# Patient Record
Sex: Male | Born: 1970 | Race: Black or African American | Hispanic: No | Marital: Married | Smoking: Never smoker
Health system: Southern US, Community
[De-identification: ages and names within clinical notes are randomized; demographics above are authoritative.]

## PROBLEM LIST (undated history)

## (undated) DIAGNOSIS — G4733 Obstructive sleep apnea (adult) (pediatric): Secondary | ICD-10-CM

## (undated) DIAGNOSIS — E039 Hypothyroidism, unspecified: Secondary | ICD-10-CM

## (undated) DIAGNOSIS — J309 Allergic rhinitis, unspecified: Secondary | ICD-10-CM

## (undated) DIAGNOSIS — G4730 Sleep apnea, unspecified: Secondary | ICD-10-CM

## (undated) DIAGNOSIS — R011 Cardiac murmur, unspecified: Secondary | ICD-10-CM

## (undated) DIAGNOSIS — J45909 Unspecified asthma, uncomplicated: Secondary | ICD-10-CM

## (undated) DIAGNOSIS — N62 Hypertrophy of breast: Secondary | ICD-10-CM

## (undated) DIAGNOSIS — E785 Hyperlipidemia, unspecified: Secondary | ICD-10-CM

## (undated) DIAGNOSIS — R400 Somnolence: Secondary | ICD-10-CM

## (undated) DIAGNOSIS — J302 Other seasonal allergic rhinitis: Secondary | ICD-10-CM

## (undated) DIAGNOSIS — M545 Low back pain, unspecified: Secondary | ICD-10-CM

## (undated) DIAGNOSIS — S42309A Unspecified fracture of shaft of humerus, unspecified arm, initial encounter for closed fracture: Secondary | ICD-10-CM

## (undated) DIAGNOSIS — K429 Umbilical hernia without obstruction or gangrene: Secondary | ICD-10-CM

## (undated) DIAGNOSIS — E781 Pure hyperglyceridemia: Secondary | ICD-10-CM

## (undated) DIAGNOSIS — S62109A Fracture of unspecified carpal bone, unspecified wrist, initial encounter for closed fracture: Secondary | ICD-10-CM

## (undated) DIAGNOSIS — R945 Abnormal results of liver function studies: Secondary | ICD-10-CM

## (undated) DIAGNOSIS — R7989 Other specified abnormal findings of blood chemistry: Secondary | ICD-10-CM

## (undated) DIAGNOSIS — I10 Essential (primary) hypertension: Secondary | ICD-10-CM

## (undated) DIAGNOSIS — G8929 Other chronic pain: Secondary | ICD-10-CM

## (undated) HISTORY — DX: Umbilical hernia without obstruction or gangrene: K42.9

## (undated) HISTORY — DX: Other chronic pain: G89.29

## (undated) HISTORY — DX: Sleep apnea, unspecified: G47.30

## (undated) HISTORY — PX: WISDOM TOOTH EXTRACTION: SHX21

## (undated) HISTORY — DX: Unspecified fracture of shaft of humerus, unspecified arm, initial encounter for closed fracture: S42.309A

## (undated) HISTORY — DX: Hyperlipidemia, unspecified: E78.5

## (undated) HISTORY — DX: Low back pain: M54.5

## (undated) HISTORY — DX: Other seasonal allergic rhinitis: J30.2

## (undated) HISTORY — DX: Other specified abnormal findings of blood chemistry: R79.89

## (undated) HISTORY — DX: Hypertrophy of breast: N62

## (undated) HISTORY — DX: Allergic rhinitis, unspecified: J30.9

## (undated) HISTORY — DX: Somnolence: R40.0

## (undated) HISTORY — DX: Fracture of unspecified carpal bone, unspecified wrist, initial encounter for closed fracture: S62.109A

## (undated) HISTORY — DX: Hypothyroidism, unspecified: E03.9

## (undated) HISTORY — DX: Essential (primary) hypertension: I10

## (undated) HISTORY — DX: Low back pain, unspecified: M54.50

## (undated) HISTORY — DX: Abnormal results of liver function studies: R94.5

## (undated) HISTORY — DX: Pure hyperglyceridemia: E78.1

## (undated) HISTORY — DX: Obstructive sleep apnea (adult) (pediatric): G47.33

---
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
## 1993-09-29 HISTORY — PX: ANKLE SURGERY: SHX546

## 1999-05-31 HISTORY — PX: OTHER SURGICAL HISTORY: SHX169

## 2002-11-07 ENCOUNTER — Encounter: Payer: Self-pay | Admitting: Internal Medicine

## 2002-11-07 ENCOUNTER — Encounter: Admission: RE | Admit: 2002-11-07 | Discharge: 2002-11-07 | Payer: Self-pay | Admitting: Internal Medicine

## 2009-04-05 ENCOUNTER — Encounter: Admission: RE | Admit: 2009-04-05 | Discharge: 2009-04-05 | Payer: Self-pay | Admitting: Internal Medicine

## 2013-09-20 ENCOUNTER — Ambulatory Visit (HOSPITAL_COMMUNITY): Payer: BC Managed Care – PPO | Attending: Cardiology | Admitting: Radiology

## 2013-09-20 ENCOUNTER — Other Ambulatory Visit (HOSPITAL_COMMUNITY): Payer: Self-pay | Admitting: Internal Medicine

## 2013-09-20 ENCOUNTER — Encounter: Payer: Self-pay | Admitting: Cardiology

## 2013-09-20 DIAGNOSIS — R9431 Abnormal electrocardiogram [ECG] [EKG]: Secondary | ICD-10-CM

## 2013-09-20 DIAGNOSIS — I10 Essential (primary) hypertension: Secondary | ICD-10-CM | POA: Insufficient documentation

## 2013-09-20 NOTE — Progress Notes (Signed)
Echocardiogram performed.  

## 2014-03-23 ENCOUNTER — Encounter: Payer: Self-pay | Admitting: Neurology

## 2014-03-24 ENCOUNTER — Ambulatory Visit (INDEPENDENT_AMBULATORY_CARE_PROVIDER_SITE_OTHER): Payer: Managed Care, Other (non HMO) | Admitting: Neurology

## 2014-03-24 ENCOUNTER — Encounter: Payer: Self-pay | Admitting: Neurology

## 2014-03-24 ENCOUNTER — Encounter (INDEPENDENT_AMBULATORY_CARE_PROVIDER_SITE_OTHER): Payer: Self-pay

## 2014-03-24 VITALS — BP 124/88 | HR 72 | Resp 18 | Ht 68.75 in | Wt 243.0 lb

## 2014-03-24 DIAGNOSIS — R0989 Other specified symptoms and signs involving the circulatory and respiratory systems: Secondary | ICD-10-CM

## 2014-03-24 DIAGNOSIS — G4733 Obstructive sleep apnea (adult) (pediatric): Secondary | ICD-10-CM

## 2014-03-24 DIAGNOSIS — R0609 Other forms of dyspnea: Secondary | ICD-10-CM

## 2014-03-24 DIAGNOSIS — G4700 Insomnia, unspecified: Secondary | ICD-10-CM

## 2014-03-24 DIAGNOSIS — R0683 Snoring: Secondary | ICD-10-CM

## 2014-03-24 DIAGNOSIS — G4730 Sleep apnea, unspecified: Secondary | ICD-10-CM

## 2014-03-24 NOTE — Progress Notes (Signed)
[facility] SLEEP MEDICINE CONSULT   Provider:  Larey Seat, M D  Referring Provider: Precious Reel, MD Primary Care Physician:  Precious Reel, MD  Chief Complaint  Patient presents with  . New Evaluation    Room 10  . Sleep consult    HPI:  Shawn Barr is a 43 y.o. male patient. He is seen here as a referral from Dr. Virgina Jock for an apnea evaluation.   Shawn Barr  is a 43 year old right-handed African American gentleman presents with a past medical history of hypothyroidism, allergic asthma, hyperlipidemia and blood pressure elevation.  He is taking Norvasc, lisinopril, Lipitor, Advair, Synthroid the above named conditions he also has a Ventolin inhaler for emergencies , when wheezing.  Dr. Virgina Jock has been directing the patient for an obstructive sleep apnea evaluation. The patient had been tested before and received actually a CPAP in November 2010.  The study was done at [facility] .  At the time he was also diagnosed with elevated liver enzymes and umbillical hernia as well. He felt that apnea wasn't a major factor in his health and wellness.   The patient has never been a smoker, does not use caffeinated beverages, and drinks less than 1 alcoholic beverage a day.  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 He does all this in the bedroom.  Sometimes he sleeps while the TV plays all night. He wakes up at % am for a bathroom break , goes back to sleep and rises at 8. He decides when work starts , he advised. He  needs 2 alarms to get up. He drinks water, no coffee.  He usually feels "OK in the morning. He has variable sleep times and often plays video games at night. His overall sleep time is less than 5 hours.  He sometimes naps in daytime - after work , which concludes at 5 - by 5.30 PM to 7 PM he may nap. He feels refreshed after these  long naps.   He has no longer CPAP - he felt it brought on asthma, but he never changed the water reservoirs content.  He is new to the concepts of basic sleep hygiene.     Review of Systems: Out of a complete 14 system review, the patient complains of only the following symptoms, and all other reviewed systems are negative. epworth 8 , FSS 16, depression score 3 .  History   Social History  . Marital Status: Married    Spouse Name: [name]    Number of Children: 1  . Years of Education: College   Occupational History  . Not on file.   Social History Main Topics  . Smoking status: Never Smoker   . Smokeless tobacco: Never Used  . Alcohol Use: Yes     Comment: occas.  . Drug Use: No  . Sexual Activity: Not on file   Other Topics Concern  . Not on file   Social History Narrative   Patient is married [name]) and lives at home with his wife and one child.   Patient is working full-time.   Patient has a college education.   Patient is right-handed.   Patient drinks 2-3 cups of tea daily.                Family History  Problem Relation  Age of Onset  . Hypertension Father   . Diabetes Father   . Sleep apnea Father   . Glaucoma Mother   . Diabetes    . Diabetes      Past Medical History  Diagnosis Date  . Allergic rhinitis   . Hyperlipidemia   . Hypertriglyceridemia   . Hypothyroidism   . Daytime somnolence   . OSA (obstructive sleep apnea)   . Chronic LBP   . Gynecomastia   . Elevated LFTs     Fatty liver  . Umbilical hernia   . Broken wrist   . Broken arm     Left  . Hypertension     Past Surgical History  Procedure Laterality Date  . Ankle surgery  1995  . Tonsillectomy  1991  . Arm surgery  2000's  . Hand surgery  1992    Current Outpatient Prescriptions  Medication Sig Dispense Refill  . albuterol (PROVENTIL HFA;VENTOLIN HFA) 108 (90 BASE) MCG/ACT inhaler Inhale into the lungs every 6 (six) hours as needed for wheezing or shortness of  breath.      Marland Kitchen amLODipine (NORVASC) 5 MG tablet Take 5 mg by mouth daily.      Marland Kitchen atorvastatin (LIPITOR) 40 MG tablet Take 40 mg by mouth daily.      . fluticasone-salmeterol (ADVAIR HFA) 115-21 MCG/ACT inhaler Inhale 1 puff into the lungs daily.      Marland Kitchen levothyroxine (SYNTHROID, LEVOTHROID) 50 MCG tablet Take 50 mcg by mouth daily before breakfast.      . lisinopril (PRINIVIL,ZESTRIL) 40 MG tablet 1 tablet daily.      Marland Kitchen loratadine (CLARITIN) 10 MG tablet Take 10 mg by mouth daily.       No current facility-administered medications for this visit.    Allergies as of 03/24/2014  . (Not on File)    Vitals: BP 124/88  Pulse 72  Resp 18  Ht 5' 8.75" (1.746 m)  Wt 243 lb (110.224 kg)  BMI 36.16 kg/m2 Last Weight:  Wt Readings from Last 1 Encounters:  03/24/14 243 lb (110.224 kg)   Last Height:   Ht Readings from Last 1 Encounters:  03/24/14 5' 8.75" (1.746 m)     Physical exam:  General: The patient is awake, alert and appears not in acute distress. The patient is well groomed. Head: Normocephalic, atraumatic. Neck is supple. Mallampati 4 , neck circumference: 19.75 - almost 20 inches , no TMJ, No bruxism.  Cardiovascular:  Regular rate and rhythm, without  murmurs or carotid bruit, and without distended neck veins. Respiratory: wheezing  to auscultation. Skin:  Without evidence of edema, or rash Trunk: BMI is elevated .Patient has normal posture.  Neurologic exam : The patient is awake and alert, oriented to place and time.  Memory subjective  described as intact. There is a normal attention span & concentration ability.  Speech is fluent without  dysarthria, dysphonia or aphasia. Mood and affect are appropriate.  Cranial nerves: Pupils are equal and briskly reactive to light. Funduscopic exam without evidence of pallor or edema. Extraocular movements  in vertical and horizontal planes intact and without nystagmus. Visual fields by finger perimetry are intact. Hearing to  finger rub intact.   Facial sensation intact to fine touch. Facial motor strength is symmetric and tongue and uvula move midline.  Motor exam:   Normal tone , muscle bulk and symmetric normal strength in all extremities.  Sensory:  Fine touch, pinprick and vibration were  normal.  Coordination: Rapid  alternating movements in the fingers/hands is tested and normal.  Finger-to-nose maneuver tested and normal without evidence of ataxia, dysmetria or tremor.  Gait and station: Patient walks without assistive device.Deep tendon reflexes: in the  upper and lower extremities are symmetric and intact. Babinski maneuver response is downgoing.   Assessment:  After physical and neurologic examination, review of laboratory studies, imaging, neurophysiology testing and pre-existing records, assessment is   Patient with cumulative sleep deficits based in poor routines and habits.  1) keep light sources out of the bedroom. After midnight no screen.  2) establish a bedtime of midnight at the latest. 3) Loud , witnessed snoring and witnessed apneas in this now untreated patient.  Brochure ; " you have OSA-"  And  I explained  all the risk factors, mainly weight - we will establish a diagnostic baseline and decide on a  therapy based on that. CPAP machines need clean water , distilled, to work properly.  There needs to be a regular change in filters and tubing , etc.  He has a higher risk of hypertension, arrhythmia, CVA with untreated OSA.       Plan:  Treatment plan and additional workup :

## 2014-03-24 NOTE — Patient Instructions (Signed)
Sleep Apnea  Sleep apnea is a sleep disorder characterized by abnormal pauses in breathing while you sleep. When your breathing pauses, the level of oxygen in your blood decreases. This causes you to move out of deep sleep and into light sleep. As a result, your quality of sleep is poor, and the system that carries your blood throughout your body (cardiovascular system) experiences stress. If sleep apnea remains untreated, the following conditions can develop:  High blood pressure (hypertension).  Coronary artery disease.  Inability to achieve or maintain an erection (impotence).  Impairment of your thought process (cognitive dysfunction). There are three types of sleep apnea: 1. Obstructive sleep apnea--Pauses in breathing during sleep because of a blocked airway. 2. Central sleep apnea--Pauses in breathing during sleep because the area of the brain that controls your breathing does not send the correct signals to the muscles that control breathing. 3. Mixed sleep apnea--A combination of both obstructive and central sleep apnea. RISK FACTORS The following risk factors can increase your risk of developing sleep apnea:  Being overweight.  Smoking.  Having narrow passages in your nose and throat.  Being of older age.  Being male.  Alcohol use.  Sedative and tranquilizer use.  Ethnicity. Among individuals younger than 35 years, African Americans are at increased risk of sleep apnea. SYMPTOMS   Difficulty staying asleep.  Daytime sleepiness and fatigue.  Loss of energy.  Irritability.  Loud, heavy snoring.  Morning headaches.  Trouble concentrating.  Forgetfulness.  Decreased interest in sex. DIAGNOSIS  In order to diagnose sleep apnea, your caregiver will perform a physical examination. Your caregiver may suggest that you take a home sleep test. Your caregiver may also recommend that you spend the night in a sleep lab. In the sleep lab, several monitors record  information about your heart, lungs, and brain while you sleep. Your leg and arm movements and blood oxygen level are also recorded. TREATMENT The following actions may help to resolve mild sleep apnea:  Sleeping on your side.   Using a decongestant if you have nasal congestion.   Avoiding the use of depressants, including alcohol, sedatives, and narcotics.   Losing weight and modifying your diet if you are overweight. There also are devices and treatments to help open your airway:  Oral appliances. These are custom-made mouthpieces that shift your lower jaw forward and slightly open your bite. This opens your airway.  Devices that create positive airway pressure. This positive pressure "splints" your airway open to help you breathe better during sleep. The following devices create positive airway pressure:  Continuous positive airway pressure (CPAP) device. The CPAP device creates a continuous level of air pressure with an air pump. The air is delivered to your airway through a mask while you sleep. This continuous pressure keeps your airway open.  Nasal expiratory positive airway pressure (EPAP) device. The EPAP device creates positive air pressure as you exhale. The device consists of single-use valves, which are inserted into each nostril and held in place by adhesive. The valves create very little resistance when you inhale but create much more resistance when you exhale. That increased resistance creates the positive airway pressure. This positive pressure while you exhale keeps your airway open, making it easier to breath when you inhale again.  Bilevel positive airway pressure (BPAP) device. The BPAP device is used mainly in patients with central sleep apnea. This device is similar to the CPAP device because it also uses an air pump to deliver continuous air pressure   through a mask. However, with the BPAP machine, the pressure is set at two different levels. The pressure when you  exhale is lower than the pressure when you inhale.  Surgery. Typically, surgery is only done if you cannot comply with less invasive treatments or if the less invasive treatments do not improve your condition. Surgery involves removing excess tissue in your airway to create a wider passage way. Document Released: 09/05/2002 Document Revised: 01/10/2013 Document Reviewed: 01/22/2012 Exit[employer] Patient Information 2015 [employer]. This information is not intended to replace advice given to you by your health care provider. Make sure you discuss any questions you have with your health care provider.  

## 2014-05-10 ENCOUNTER — Ambulatory Visit: Payer: Managed Care, Other (non HMO) | Admitting: Neurology

## 2014-05-10 ENCOUNTER — Encounter: Payer: Self-pay | Admitting: Neurology

## 2014-05-10 DIAGNOSIS — G4733 Obstructive sleep apnea (adult) (pediatric): Secondary | ICD-10-CM

## 2014-05-10 DIAGNOSIS — R0989 Other specified symptoms and signs involving the circulatory and respiratory systems: Secondary | ICD-10-CM

## 2014-05-10 DIAGNOSIS — R0609 Other forms of dyspnea: Secondary | ICD-10-CM

## 2014-06-13 ENCOUNTER — Other Ambulatory Visit: Payer: Self-pay | Admitting: *Deleted

## 2014-06-13 ENCOUNTER — Other Ambulatory Visit: Payer: Self-pay | Admitting: Neurology

## 2014-06-13 ENCOUNTER — Telehealth: Payer: Self-pay | Admitting: *Deleted

## 2014-06-13 DIAGNOSIS — G4733 Obstructive sleep apnea (adult) (pediatric): Secondary | ICD-10-CM

## 2014-06-13 DIAGNOSIS — G4730 Sleep apnea, unspecified: Secondary | ICD-10-CM

## 2014-06-13 DIAGNOSIS — G4700 Insomnia, unspecified: Secondary | ICD-10-CM

## 2014-06-13 DIAGNOSIS — R0683 Snoring: Secondary | ICD-10-CM

## 2014-06-13 NOTE — Telephone Encounter (Signed)
Contacted patient and informed him of the results of his diagnostic study.  Patient was understanding that he had tested positive for obstructive sleep apnea and that treatment with CPAP had been advised.  Patient was informed that a copy of the results would be provided to the referring physician; Shon Baton, MD, and that a copy would be mailed to him for his review.  Patient was in agreement with use of therapy and was instructed to contact the office with further questions.

## 2014-06-14 ENCOUNTER — Encounter: Payer: Self-pay | Admitting: Neurology

## 2014-08-11 ENCOUNTER — Encounter: Payer: Self-pay | Admitting: Neurology

## 2015-03-28 ENCOUNTER — Other Ambulatory Visit: Payer: Self-pay | Admitting: Internal Medicine

## 2015-03-28 DIAGNOSIS — K760 Fatty (change of) liver, not elsewhere classified: Secondary | ICD-10-CM

## 2015-04-03 ENCOUNTER — Other Ambulatory Visit: Payer: Self-pay

## 2015-10-18 ENCOUNTER — Other Ambulatory Visit: Payer: Self-pay

## 2015-10-24 ENCOUNTER — Other Ambulatory Visit: Payer: Self-pay

## 2015-10-26 ENCOUNTER — Ambulatory Visit
Admission: RE | Admit: 2015-10-26 | Discharge: 2015-10-26 | Disposition: A | Discharge status: Home / Self Care | Payer: Managed Care, Other (non HMO) | Source: Ambulatory Visit | Attending: Internal Medicine | Admitting: Internal Medicine

## 2015-10-26 DIAGNOSIS — K760 Fatty (change of) liver, not elsewhere classified: Secondary | ICD-10-CM

## 2016-02-14 ENCOUNTER — Ambulatory Visit (INDEPENDENT_AMBULATORY_CARE_PROVIDER_SITE_OTHER): Payer: Managed Care, Other (non HMO) | Admitting: Neurology

## 2016-02-14 ENCOUNTER — Encounter: Payer: Self-pay | Admitting: Neurology

## 2016-02-14 VITALS — BP 150/96 | HR 70 | Resp 20 | Ht 69.0 in | Wt 249.0 lb

## 2016-02-14 DIAGNOSIS — G4733 Obstructive sleep apnea (adult) (pediatric): Secondary | ICD-10-CM

## 2016-02-14 DIAGNOSIS — J989 Respiratory disorder, unspecified: Secondary | ICD-10-CM

## 2016-02-14 DIAGNOSIS — T7840XS Allergy, unspecified, sequela: Secondary | ICD-10-CM

## 2016-02-14 DIAGNOSIS — Z9989 Dependence on other enabling machines and devices: Principal | ICD-10-CM

## 2016-02-14 NOTE — Patient Instructions (Signed)

## 2016-02-14 NOTE — Progress Notes (Signed)
[facility] SLEEP MEDICINE CONSULT   Provider:  Larey Seat, M D  Referring Provider: Shon Baton, MD Primary Care Physician:  Precious Reel, MD  Chief Complaint  Patient presents with  . Follow-up    referred back in from Dr. Virgina Jock, last seen by Dr. Brett Fairy in 2015, uses [facility]  . Sleep Apnea    HPI:  CURTIS MARKHAM is a 45 y.o. male patient. He is seen here as a referral from Dr. Virgina Jock for an apnea evaluation.   Mr. Highlander  is a 45 year old right-handed African American gentleman presents with a past medical history of hypothyroidism, allergic asthma, hyperlipidemia and blood pressure elevation.  He is taking Norvasc, lisinopril, Lipitor, Advair, Synthroid the above named conditions he also has a Ventolin inhaler for emergencies , when wheezing.  Dr. Virgina Jock has been directing the patient for an obstructive sleep apnea evaluation. The patient had been tested before and received actually a CPAP in November 2010.  The study was done at [place] .  At the time he was also diagnosed with elevated liver enzymes and umbillical hernia as well. He felt that apnea wasn't a major factor in his health and wellness.   The patient has never been a smoker, does not use caffeinated beverages, and drinks less than 1 alcoholic beverage a day.  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 He does all this in the bedroom.  Sometimes he sleeps while the TV plays all night. He wakes up at % am for a bathroom break , goes back to sleep and rises at 8. He decides when work starts , he advised. He  needs 2 alarms to get up. He drinks water, no coffee.  He usually feels "OK in the morning. He has variable sleep times and often plays video games at night. His overall sleep time is less than 5 hours.  He sometimes naps in daytime - after work , which concludes at 5 - by  5.30 PM to 7 PM he may nap. He feels refreshed after these long naps.   He has no longer CPAP - he felt it brought on asthma, but he never changed the water reservoirs content.  He is new to the concepts of basic sleep hygiene.   New evaluation from 02/14/2016,    Mr. Javorsky is seen here today for follow-up. He underwent a sleep study on 05/10/2014 . He has the comorbidities of year-long allergic respiratory tract complications, allergic rhinitis, sinusitis and at times wheezing. Hyperlipidemia, hypothyroidism, and obesity. The patient's AHI was 33.0 REM sleep was not seen and his initial sleep study and there was no strong supine versus nonsupine accentuation. Critical value was his nadir of oxygen at 68% with  56.2 minutes of desaturations.  Mr. Bartsch has continued to use his CPAP that was originally prescribed in 2015; He  has 90% compliance for days of use, 83% compliance for over 4 hours of nightly use. His average user time is 5 hours and 55 minutes, he uses an AutoSet between 6 and 14 cm water with 3 cm EPR and resulting in an AHI of 0.7. The resolution is excellent. He continues to have some shortness of breath and occasionally bronchitic wheezing. Dr. Keane Police notes also state that he has microalbuminuria, and that his obesity has been associated with the larger liver and  high liver transaminases.    Review of Systems: Out of a complete 14 system review, the patient complains of only the following symptoms, and all other reviewed systems are negative. epworth 7, FSS 16,  PH Q 9 depression score 3 .  Social History   Social History  . Marital Status: Married    Spouse Name: [name]  . Number of Children: 1  . Years of Education: College   Occupational History  . Not on file.   Social History Main Topics  . Smoking status: Never Smoker   . Smokeless tobacco: Never Used  . Alcohol Use: Yes     Comment: occas.  . Drug Use: No  . Sexual Activity: Not on file   Other Topics  Concern  . Not on file   Social History Narrative   Patient is married [name]) and lives at home with his wife and one child.   Patient is working full-time.   Patient has a college education.   Patient is right-handed.   Patient drinks 2-3 cups of tea daily.                Family History  Problem Relation Age of Onset  . Hypertension Father   . Diabetes Father   . Sleep apnea Father   . Glaucoma Mother   . Diabetes    . Diabetes      Past Medical History  Diagnosis Date  . Allergic rhinitis   . Hyperlipidemia   . Hypertriglyceridemia   . Hypothyroidism   . Daytime somnolence   . OSA (obstructive sleep apnea)   . Chronic LBP   . Gynecomastia   . Elevated LFTs     Fatty liver  . Umbilical hernia   . Broken wrist   . Broken arm     Left  . Hypertension     Past Surgical History  Procedure Laterality Date  . Ankle surgery  1995  . Tonsillectomy  1991  . Arm surgery  2000's  . Hand surgery  1992    Current Outpatient Prescriptions  Medication Sig Dispense Refill  . albuterol (PROVENTIL HFA;VENTOLIN HFA) 108 (90 BASE) MCG/ACT inhaler Inhale into the lungs every 6 (six) hours as needed for wheezing or shortness of breath.    Marland Kitchen amLODipine (NORVASC) 10 MG tablet Take 10 mg by mouth daily.    Marland Kitchen atorvastatin (LIPITOR) 80 MG tablet Take 80 mg by mouth daily.    . fexofenadine (ALLEGRA) 180 MG tablet Take 180 mg by mouth daily.    . fluticasone-salmeterol (ADVAIR HFA) 115-21 MCG/ACT inhaler Inhale 1 puff into the lungs daily.    Marland Kitchen loratadine (CLARITIN) 10 MG tablet Take 10 mg by mouth daily.     No current facility-administered medications for this visit.    Allergies as of 02/14/2016  . (No Known Allergies)    Vitals: BP 150/96 mmHg  Pulse 70  Resp 20  Ht 5\' 9"  (1.753 m)  Wt 249 lb (112.946 kg)  BMI 36.75 kg/m2 Last Weight:  Wt Readings from Last 1 Encounters:  02/14/16 249 lb (112.946 kg)   Last Height:   Ht Readings from Last 1 Encounters:   02/14/16 5\' 9"  (1.753 m)     Physical exam:  General: The patient is awake, alert and appears not in acute distress. The patient is well groomed. Head: Normocephalic, atraumatic. Neck is supple. Mallampati 4 , neck circumference:  20. 25  inches , no TMJ, No bruxism.  Cardiovascular:  Regular  rate and rhythm, without  murmurs or carotid bruit, and without distended neck veins. Respiratory: wheezing  to auscultation. Skin:  Without evidence of edema, or rash Trunk: BMI is elevated .Patient has normal posture. He gained weight since 2015.   Neurologic exam : The patient is awake and alert, oriented to place and time.  Memory subjective  described as intact. There is a normal attention span & concentration ability.  Speech is fluent without  dysarthria, dysphonia or aphasia. Mood and affect are appropriate.  Cranial nerves: Pupils are equal and briskly reactive to light. Funduscopic exam without evidence of pallor or edema. Extraocular movements  in vertical and horizontal planes intact and without nystagmus. Visual fields by finger perimetry are intact. Hearing to finger rub intact.   Facial sensation intact to fine touch. Facial motor strength is symmetric and tongue and uvula move midline.    Assessment:  After physical and neurologic examination, review of laboratory studies, imaging, neurophysiology testing and pre-existing records, assessment is    Plan:  Treatment plan and additional workup : Mr. peoples was diagnosed with a moderately severe sleep apnea associated with hypoxemia. He has responded well with the reduction in apneas to CPAP therapy. He is using the machine compliantly 90% of the days and 83% of over 4 hours of nightly use. Average user time is 5 hours and 55 minutes. 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 The machine is an AutoSet between 6 and 14 cm water pressure with the 95th percentile pressure of 13.5. His residual AHI is 0.7 he does not  report excessive daytime sleepiness or fatigue. I will be happy to leave him his current settings but write a prescription for new supplies for [facility] CPAP to [facility].  Rv in 53 month    Chenel Wernli, MD     Cc Dr Virgina Jock.

## 2017-02-11 ENCOUNTER — Encounter (INDEPENDENT_AMBULATORY_CARE_PROVIDER_SITE_OTHER): Payer: Self-pay

## 2017-02-11 ENCOUNTER — Encounter: Payer: Self-pay | Admitting: Neurology

## 2017-02-11 ENCOUNTER — Ambulatory Visit (INDEPENDENT_AMBULATORY_CARE_PROVIDER_SITE_OTHER): Payer: Managed Care, Other (non HMO) | Admitting: Neurology

## 2017-02-11 VITALS — BP 148/89 | HR 71 | Ht 69.0 in | Wt 260.0 lb

## 2017-02-11 DIAGNOSIS — Z9989 Dependence on other enabling machines and devices: Secondary | ICD-10-CM

## 2017-02-11 DIAGNOSIS — G4733 Obstructive sleep apnea (adult) (pediatric): Secondary | ICD-10-CM

## 2017-02-11 NOTE — Progress Notes (Signed)
[facility] SLEEP MEDICINE CONSULT   Provider:  Larey Seat, M D  Referring Provider: Shon Baton, MD Primary Care Physician:  Shon Baton, MD  Chief Complaint  Patient presents with  . Follow-up    cpap    HPI:  Shawn Barr is a 46 y.o. male patient. He is seen here as a referral from Dr. Virgina Jock for an apnea evaluation.   Shawn Barr  is a 46 year old right-handed African American gentleman presents with a past medical history of hypothyroidism, allergic asthma, hyperlipidemia and blood pressure elevation.  He is taking Norvasc, lisinopril, Lipitor, Advair, Synthroid the above named conditions he also has a Ventolin inhaler for emergencies , when wheezing.  Dr. Virgina Jock has been directing the patient for an obstructive sleep apnea evaluation. The patient had been tested before and received actually a CPAP in November 2010.  The study was done at [place] .  At the time he was also diagnosed with elevated liver enzymes and umbillical hernia as well. He felt that apnea wasn't a major factor in his health and wellness.   The patient has never been a smoker, does not use caffeinated beverages, and drinks less than 1 alcoholic beverage a day.  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 He does all this in the bedroom.  Sometimes he sleeps while the TV plays all night. He wakes up at % am for a bathroom break , goes back to sleep and rises at 8. He decides when work starts , he advised. He  needs 2 alarms to get up. He drinks water, no coffee.  He usually feels "OK in the morning. He has variable sleep times and often plays video games at night. His overall sleep time is less than 5 hours.  He sometimes naps in daytime - after work , which concludes at 5 - by 5.30 PM to 7 PM he may nap. He feels refreshed after these long naps.  He no longer has  his  CPAP - he felt it brought on asthma, but he never changed the water reservoirs content.  He is new to the concepts of basic sleep hygiene.   New evaluation from 02/14/2016, Shawn Barr is seen here today for follow-up. He underwent a sleep study on 05/10/2014 . He has the comorbidities of year-long allergic respiratory tract complications, allergic rhinitis, sinusitis and at times wheezing. Hyperlipidemia, hypothyroidism, and obesity. The patient's AHI was 33.0 REM sleep was not seen and his initial sleep study and there was no strong supine versus nonsupine accentuation. Critical value was his nadir of oxygen at 68% with  56.2 minutes of desaturations.  Shawn Barr has continued to use his CPAP that was originally prescribed in 2015; He  has 90% compliance for days of use, 83% compliance for over 4 hours of nightly use. His average user time is 5 hours and 55 minutes, he uses an AutoSet between 6 and 14 cm water with 3 cm EPR and resulting in an AHI of 0.7. The resolution is excellent. He continues to have some shortness of breath and occasionally bronchitic wheezing. Dr. Keane Police notes also state that he has microalbuminuria, and that his obesity has been associated with the larger liver and high liver transaminases. Shawn Barr was diagnosed with a moderately severe sleep apnea, associated with hypoxemia.  He has responded  well with the reduction in apneas to CPAP therapy. He is using the machine compliantly 90% of the days and 83% of over 4 hours of nightly use. Average user time is 5 hours and 55 minutes. 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 The machine is an AutoSet between 6 and 14 cm water pressure with the 95th percentile pressure of 13.5. His residual AHI is 0.7 he does not report excessive daytime sleepiness or fatigue. I will be happy to leave him his current settings but write a prescription for new supplies for his CPAP to [facility].  Interval history  from 02/11/2017. [name] returns today with an excellent compliance record on CPAP. He is using an AutoSet between 6 and 14 cm water with 3 cm EPR for an average of 6 hours and 44 minutes, is a 93% compliance and a residual AHI of 0.6 with almost no air leaks. 95th percentile pressure was 13.8 cm water. There was no evidence of central apneas emerging.    Review of Systems: Out of a complete 14 system review, the patient complains of only the following symptoms, and all other reviewed systems are negative. His Epworth sleepiness score is endorsed at 5 points and his fatigue severity at 19 points -very low. He has no sinusitis or rhinitis.    Social History   Social History  . Marital status: Married    Spouse name: Shawn Barr  . Number of children: 1  . Years of education: College   Occupational History  . Not on file.   Social History Main Topics  . Smoking status: Never Smoker  . Smokeless tobacco: Never Used  . Alcohol use Yes     Comment: occas.  . Drug use: No  . Sexual activity: Not on file   Other Topics Concern  . Not on file   Social History Narrative   Patient is married Shawn Barr) and lives at home with his wife and one child.   Patient is working full-time.   Patient has a college education.   Patient is right-handed.   Patient drinks 2-3 cups of tea daily.                Family History  Problem Relation Age of Onset  . Hypertension Father   . Diabetes Father   . Sleep apnea Father   . Glaucoma Mother   . Diabetes Unknown   . Diabetes Unknown     Past Medical History:  Diagnosis Date  . Allergic rhinitis   . Broken arm    Left  . Broken wrist   . Chronic LBP   . Daytime somnolence   . Elevated LFTs    Fatty liver  . Gynecomastia   . Hyperlipidemia   . Hypertension   . Hypertriglyceridemia   . Hypothyroidism   . OSA (obstructive sleep apnea)   . Umbilical hernia     Past Surgical History:  Procedure Laterality Date  . ANKLE SURGERY  1995   . arm surgery  2000's  . HAND SURGERY  1992  . TONSILLECTOMY  1991    Current Outpatient Prescriptions  Medication Sig Dispense Refill  . albuterol (PROVENTIL HFA;VENTOLIN HFA) 108 (90 BASE) MCG/ACT inhaler Inhale into the lungs every 6 (six) hours as needed for wheezing or shortness of breath.    Marland Kitchen amLODipine (NORVASC) 5 MG tablet Take 5 mg by mouth daily.    Marland Kitchen atorvastatin (LIPITOR) 80 MG tablet Take 80  mg by mouth daily.    . fexofenadine (ALLEGRA) 180 MG tablet Take 180 mg by mouth daily.    . fluticasone-salmeterol (ADVAIR HFA) 115-21 MCG/ACT inhaler Inhale 1 puff into the lungs daily.    Marland Kitchen loratadine (CLARITIN) 10 MG tablet Take 10 mg by mouth daily.     No current facility-administered medications for this visit.     Allergies as of 02/11/2017  . (No Known Allergies)    Vitals: BP (!) 148/89   Pulse 71   Ht 5\' 9"  (1.753 m)   Wt 260 lb (117.9 kg)   BMI 38.40 kg/m  Last Weight:  Wt Readings from Last 1 Encounters:  02/11/17 260 lb (117.9 kg)   Last Height:   Ht Readings from Last 1 Encounters:  02/11/17 5\' 9"  (1.753 m)     Physical exam:  General: The patient is awake, alert and appears not in acute distress. The patient is well groomed. Head: Normocephalic, atraumatic. Neck is supple. Mallampati 4 , neck circumference:  20. 25  inches , no TMJ, No bruxism.  Cardiovascular:  Regular rate and rhythm, without  murmurs or carotid bruit, and without distended neck veins. Respiratory: wheezing  to auscultation. Skin:  Without evidence of edema, or rash Trunk: BMI is elevated .Patient has normal posture. He gained continuously weight since 2015.   Neurologic exam : Mild dysarthria.  Cranial nerves: Pupils are equal and briskly reactive to light. Extraocular movements intact and without nystagmus. Visual fields by finger perimetry are intact.  Facial sensation intact to fine touch. Facial motor strength is symmetric and tongue and uvula move midline.  Assessment:   After physical and neurologic examination, review of laboratory studies, imaging, neurophysiology testing and pre-existing records, assessment is ; 1) OSA on CPAP, highly compliant - continue using CPAP at these settings with nasal mask and pillows.  2) He gained more weight, dicussed low carb diet. His wife is preparing the food and he feels it's hard to adhere to a diet.    Plan:  Treatment plan and additional workup :  Rv in 54 month    Truth Barot, MD     Cc Dr Virgina Jock.

## 2018-02-16 ENCOUNTER — Ambulatory Visit: Payer: Managed Care, Other (non HMO) | Admitting: Adult Health

## 2018-02-16 ENCOUNTER — Encounter: Payer: Self-pay | Admitting: Adult Health

## 2018-02-16 VITALS — BP 146/92 | HR 79 | Ht 69.0 in | Wt 261.2 lb

## 2018-02-16 DIAGNOSIS — Z9989 Dependence on other enabling machines and devices: Secondary | ICD-10-CM | POA: Diagnosis not present

## 2018-02-16 DIAGNOSIS — G4733 Obstructive sleep apnea (adult) (pediatric): Secondary | ICD-10-CM | POA: Diagnosis not present

## 2018-02-16 NOTE — Patient Instructions (Signed)
Your Plan:  Continue using CPAP nightly and >4 hours each night If your symptoms worsen or you develop new symptoms please let us know.   Thank you for coming to see us at [facility]. I hope we have been able to provide you high quality care today.  You may receive a patient satisfaction survey over the next few weeks. We would appreciate your feedback and comments so that we may continue to improve ourselves and the health of our patients.  

## 2018-02-16 NOTE — Progress Notes (Signed)
PATIENT: Shawn Barr DOB: [birth year]  REASON FOR VISIT: follow up HISTORY FROM: patient  HISTORY OF PRESENT ILLNESS: Today 02/16/18: Shawn Barr is a 47 year old male with a history of obstructive sleep apnea on CPAP.  He returns today for a compliance download.  His download indicates that he uses machine 29 out of 30 days for compliance of 97%.  On average he uses his machine 6 hours and 22 minutes.  His residual AHI is 0.8 on 6 to 14 cm of water.  He does not have a significant leak.  He reports that the machine continues to work well for him.  Denies any daytime sleepiness or fatigue.  He returns today for an evaluation.  HISTORY 02/11/2017. Shawn Barr returns today with an excellent compliance record on CPAP. He is using an AutoSet between 6 and 14 cm water with 3 cm EPR for an average of 6 hours and 44 minutes, is a 93% compliance and a residual AHI of 0.6 with almost no air leaks. 95th percentile pressure was 13.8 cm water. There was no evidence of central apneas emerging.    REVIEW OF SYSTEMS: Out of a complete 14 system review of symptoms, the patient complains only of the following symptoms, and all other reviewed systems are negative. See hpi   ESS 4  ALLERGIES: No Known Allergies  HOME MEDICATIONS: Outpatient Medications Prior to Visit  Medication Sig Dispense Refill  . albuterol (PROVENTIL HFA;VENTOLIN HFA) 108 (90 BASE) MCG/ACT inhaler Inhale into the lungs every 6 (six) hours as needed for wheezing or shortness of breath.    Marland Kitchen amLODipine (NORVASC) 5 MG tablet Take 5 mg by mouth daily.    Marland Kitchen atorvastatin (LIPITOR) 80 MG tablet Take 80 mg by mouth daily.    . fexofenadine (ALLEGRA) 180 MG tablet Take 180 mg by mouth daily.    . fluticasone-salmeterol (ADVAIR HFA) 115-21 MCG/ACT inhaler Inhale 1 puff into the lungs daily.    Marland Kitchen loratadine (CLARITIN) 10 MG tablet Take 10 mg by mouth daily.     No facility-administered medications prior to visit.     PAST MEDICAL  HISTORY: Past Medical History:  Diagnosis Date  . Allergic rhinitis   . Broken arm    Left  . Broken wrist   . Chronic LBP   . Daytime somnolence   . Elevated LFTs    Fatty liver  . Gynecomastia   . Hyperlipidemia   . Hypertension   . Hypertriglyceridemia   . Hypothyroidism   . OSA (obstructive sleep apnea)    CPAP  . Umbilical hernia     PAST SURGICAL HISTORY: Past Surgical History:  Procedure Laterality Date  . ANKLE SURGERY  1995  . arm surgery  2000's  . HAND SURGERY  1992  . TONSILLECTOMY  1991    FAMILY HISTORY: Family History  Problem Relation Age of Onset  . Hypertension Father   . Diabetes Father   . Sleep apnea Father   . Glaucoma Mother   . Diabetes Unknown   . Diabetes Unknown     SOCIAL HISTORY: Social History   Socioeconomic History  . Marital status: Married    Spouse name: [name]  . Number of children: 1  . Years of education: College  . Highest education level: Not on file  Occupational History  . Not on file  Social Needs  . Financial resource strain: Not on file  . Food insecurity:    Worry: Not on file  Inability: Not on file  . Transportation needs:    Medical: Not on file    Non-medical: Not on file  Tobacco Use  . Smoking status: Never Smoker  . Smokeless tobacco: Never Used  Substance and Sexual Activity  . Alcohol use: Yes    Comment: occas.  . Drug use: No  . Sexual activity: Not on file  Lifestyle  . Physical activity:    Days per week: Not on file    Minutes per session: Not on file  . Stress: Not on file  Relationships  . Social connections:    Talks on phone: Not on file    Gets together: Not on file    Attends religious service: Not on file    Active member of club or organization: Not on file    Attends meetings of clubs or organizations: Not on file    Relationship status: Not on file  . Intimate partner violence:    Fear of current or ex partner: Not on file    Emotionally abused: Not on file     Physically abused: Not on file    Forced sexual activity: Not on file  Other Topics Concern  . Not on file  Social History Narrative   Patient is married [name]) and lives at home with his wife and one child.   Patient is working full-time.   Patient has a college education.   Patient is right-handed.   Patient drinks 2-3 cups of tea daily.                  PHYSICAL EXAM  Vitals:   02/16/18 0837  BP: (!) 146/92  Pulse: 79  Weight: 261 lb 3.2 oz (118.5 kg)  Height: 5\' 9"  (1.753 m)   Body mass index is 38.57 kg/m.  Generalized: Well developed, in no acute distress   Neurological examination  Mentation: Alert oriented to time, place, history taking. Follows all commands speech and language fluent Cranial nerve II-XII: Pupils were equal round reactive to light. Extraocular movements were full, visual field were full on confrontational test. Facial sensation and strength were normal. Uvula tongue midline. Head turning and shoulder shrug  were normal and symmetric.  Neck circumference 19 inches, Mallampati 4+  motor: The motor testing reveals 5 over 5 strength of all 4 extremities. Good symmetric motor tone is noted throughout.  Sensory: Sensory testing is intact to soft touch on all 4 extremities. No evidence of extinction is noted.  Coordination: Cerebellar testing reveals good finger-nose-finger and heel-to-shin bilaterally.  Gait and station: Gait is normal. Tandem gait is normal. Romberg is negative. No drift is seen.  Reflexes: Deep tendon reflexes are symmetric and normal bilaterally.   DIAGNOSTIC DATA (LABS, IMAGING, TESTING) - I reviewed patient records, labs, notes, testing and imaging myself where available.     ASSESSMENT AND PLAN 47 y.o. year old male  has a past medical history of Allergic rhinitis, Broken arm, Broken wrist, Chronic LBP, Daytime somnolence, Elevated LFTs, Gynecomastia, Hyperlipidemia, Hypertension, Hypertriglyceridemia, Hypothyroidism, OSA  (obstructive sleep apnea), and Umbilical hernia. here with ;  1.  Obstructive sleep apnea on CPAP  The patient CPAP download indicates excellent compliance and good treatment of his apnea.  He is encouraged to continue using the CPAP nightly.  He is advised that if his symptoms worsen or he develops new symptoms he should let us know.  He will follow-up in 6 months or sooner if needed.  I spent 15 minutes with the  patient. 50% of this time was spent reviewing his CPAP download   Ward Givens, MSN, NP-C 02/16/2018, 9:01 AM [facility] [address] [place], [place] [address] [phone]

## 2018-02-19 NOTE — Progress Notes (Signed)
I agree with the assessment and plan as directed by NP .The patient is known to me .   Lukis Bunt, MD  

## 2019-02-22 ENCOUNTER — Telehealth: Payer: Self-pay | Admitting: *Deleted

## 2019-02-22 NOTE — Telephone Encounter (Signed)
Due to current COVID 19 pandemic, our office is severely reducing in office visits until further notice, in order to minimize the risk to our patients and healthcare providers.  Consented to [email or web address].  Pt understands that although there may be some limitations with this type of visit, we will take all precautions to reduce any security or privacy concerns.  Pt understands that this will be treated like an in office visit and we will file with pt's insurance, and there may be a patient responsible charge related to this service.  Email sent to [email or web address].

## 2019-02-28 ENCOUNTER — Encounter: Payer: Self-pay | Admitting: Adult Health

## 2019-02-28 ENCOUNTER — Ambulatory Visit (INDEPENDENT_AMBULATORY_CARE_PROVIDER_SITE_OTHER): Payer: Managed Care, Other (non HMO) | Admitting: Adult Health

## 2019-02-28 ENCOUNTER — Other Ambulatory Visit: Payer: Self-pay

## 2019-02-28 DIAGNOSIS — Z9989 Dependence on other enabling machines and devices: Secondary | ICD-10-CM | POA: Diagnosis not present

## 2019-02-28 DIAGNOSIS — G4733 Obstructive sleep apnea (adult) (pediatric): Secondary | ICD-10-CM

## 2019-02-28 NOTE — Progress Notes (Signed)
PATIENT: Shawn Barr DOB: 10-13-1970  REASON FOR VISIT: follow up HISTORY FROM: patient  Virtual Visit via Video Note  I connected with Troy on 02/28/19 at  8:30 AM EDT by a video enabled telemedicine application located remotely at [facility] and verified that I am speaking with the correct person using two identifiers who was located at their own home.   I discussed the limitations of evaluation and management by telemedicine and the availability of in person appointments. The patient expressed understanding and agreed to proceed.   PATIENT: Shawn Barr DOB: [birth year]  REASON FOR VISIT: follow up HISTORY FROM: patient  HISTORY OF PRESENT ILLNESS: Today 02/28/19:  Mr. Wolin is a 48 year old male with a history of obstructive sleep apnea on CPAP.  His CPAP download indicates that he use his machine 26 out of 30 days for compliance of 87%.  He uses machine greater than 4 hours 22 out of 30 days for compliance of 73%.  On average he uses his machine 5 hours and 44 minutes.  His residual AHI is 0.6 on 6 to 14 cm of water.  He does not have a significant leak.  He reports that the CPAP continues to be beneficial.  He denies any new symptoms.  He joins me today for a virtual visit    REVIEW OF SYSTEMS: Out of a complete 14 system review of symptoms, the patient complains only of the following symptoms, and all other reviewed systems are negative.   See HPI ALLERGIES: No Known Allergies  HOME MEDICATIONS: Outpatient Medications Prior to Visit  Medication Sig Dispense Refill  . albuterol (PROVENTIL HFA;VENTOLIN HFA) 108 (90 BASE) MCG/ACT inhaler Inhale into the lungs every 6 (six) hours as needed for wheezing or shortness of breath.    Marland Kitchen amLODipine (NORVASC) 5 MG tablet Take 5 mg by mouth daily.    Marland Kitchen atorvastatin (LIPITOR) 80 MG tablet Take 80 mg by mouth daily.    . fexofenadine (ALLEGRA) 180 MG tablet Take 180 mg by mouth daily.     . fluticasone-salmeterol (ADVAIR HFA) 115-21 MCG/ACT inhaler Inhale 1 puff into the lungs daily.    Marland Kitchen loratadine (CLARITIN) 10 MG tablet Take 10 mg by mouth daily.     No facility-administered medications prior to visit.     PAST MEDICAL HISTORY: Past Medical History:  Diagnosis Date  . Allergic rhinitis   . Broken arm    Left  . Broken wrist   . Chronic LBP   . Daytime somnolence   . Elevated LFTs    Fatty liver  . Gynecomastia   . Hyperlipidemia   . Hypertension   . Hypertriglyceridemia   . Hypothyroidism   . OSA (obstructive sleep apnea)    CPAP  . Umbilical hernia     PAST SURGICAL HISTORY: Past Surgical History:  Procedure Laterality Date  . ANKLE SURGERY  1995  . arm surgery  2000's  . HAND SURGERY  1992  . TONSILLECTOMY  1991    FAMILY HISTORY: Family History  Problem Relation Age of Onset  . Hypertension Father   . Diabetes Father   . Sleep apnea Father   . Glaucoma Mother   . Diabetes Unknown   . Diabetes Unknown     SOCIAL HISTORY: Social History   Socioeconomic History  . Marital status: Married    Spouse name: [name]  . Number of children: 1  . Years of education: College  . Highest education  level: Not on file  Occupational History  . Not on file  Social Needs  . Financial resource strain: Not on file  . Food insecurity:    Worry: Not on file    Inability: Not on file  . Transportation needs:    Medical: Not on file    Non-medical: Not on file  Tobacco Use  . Smoking status: Never Smoker  . Smokeless tobacco: Never Used  Substance and Sexual Activity  . Alcohol use: Yes    Comment: occas.  . Drug use: No  . Sexual activity: Not on file  Lifestyle  . Physical activity:    Days per week: Not on file    Minutes per session: Not on file  . Stress: Not on file  Relationships  . Social connections:    Talks on phone: Not on file    Gets together: Not on file    Attends religious service: Not on file    Active member of club  or organization: Not on file    Attends meetings of clubs or organizations: Not on file    Relationship status: Not on file  . Intimate partner violence:    Fear of current or ex partner: Not on file    Emotionally abused: Not on file    Physically abused: Not on file    Forced sexual activity: Not on file  Other Topics Concern  . Not on file  Social History Narrative   Patient is married [name]) and lives at home with his wife and one child.   Patient is working full-time.   Patient has a college education.   Patient is right-handed.   Patient drinks 2-3 cups of tea daily.                  PHYSICAL EXAM Generalized: Well developed, in no acute distress   Neurological examination  Mentation: Alert oriented to time, place, history taking. Follows all commands speech and language fluent Cranial nerve II-XII:Extraocular movements were full. Facial symmetry noted. uvula tongue midline. Head turning and shoulder shrug  were normal and symmetric. Motor: Good strength throughout subjectively per patient Sensory: Sensory testing is intact to soft touch on all 4 extremities subjectively per patient Coordination: Cerebellar testing reveals good finger-nose-finger .  Reflexes: UTA  DIAGNOSTIC DATA (LABS, IMAGING, TESTING) - I reviewed patient records, labs, notes, testing and imaging myself where available.     ASSESSMENT AND PLAN 47 y.o. year old male  has a past medical history of Allergic rhinitis, Broken arm, Broken wrist, Chronic LBP, Daytime somnolence, Elevated LFTs, Gynecomastia, Hyperlipidemia, Hypertension, Hypertriglyceridemia, Hypothyroidism, OSA (obstructive sleep apnea), and Umbilical hernia. here with:  1.  Obstructive sleep apnea on CPAP  The patient CPAP download shows excellent compliance and good treatment of his apnea.  He is encouraged to continue using the CPAP nightly and greater than 4 hours each night.  He is advised that if his symptoms worsen or he  develops new symptoms he should let us know.  He will follow-up in 1 year or sooner if needed.   I spent 15 minutes with the patient. this time was spent reviewing his CPAP download   Ward Givens, MSN, NP-C 02/28/2019, 8:27 AM [facility] [address] [place], [place] [address] [phone]

## 2020-02-27 ENCOUNTER — Encounter: Payer: Self-pay | Admitting: Adult Health

## 2020-02-29 ENCOUNTER — Encounter: Payer: Self-pay | Admitting: Adult Health

## 2020-02-29 ENCOUNTER — Telehealth: Payer: Self-pay | Admitting: *Deleted

## 2020-02-29 ENCOUNTER — Other Ambulatory Visit: Payer: Self-pay

## 2020-02-29 ENCOUNTER — Ambulatory Visit: Payer: Managed Care, Other (non HMO) | Admitting: Adult Health

## 2020-02-29 VITALS — BP 157/95 | HR 76 | Ht 69.0 in | Wt 262.0 lb

## 2020-02-29 DIAGNOSIS — Z9989 Dependence on other enabling machines and devices: Secondary | ICD-10-CM

## 2020-02-29 DIAGNOSIS — G4733 Obstructive sleep apnea (adult) (pediatric): Secondary | ICD-10-CM | POA: Diagnosis not present

## 2020-02-29 NOTE — Progress Notes (Signed)
PATIENT: Shawn Barr DOB: 1971-09-20  REASON FOR VISIT: follow up HISTORY FROM: patient  HISTORY OF PRESENT ILLNESS: Today 02/29/20:  Shawn Barr is a 49 year old male with a history of obstructive sleep apnea on CPAP.  His download indicates that he uses machine 29 out of 30 days for compliance of 97%.  He uses machine greater than 4 hours 25 days for compliance of 83%.  On average he uses his machine 5 hours and 38 minutes.  His residual AHI is 0.9 on 6 to 14 cm of water with EPR 3.  Leak in the 95th percentile is 0.1 L/min he reports that the CPAP works well for him.  He does not like going without it.  Does report that his insurance is no longer in network with [facility] he will need to switch to another DME company  HISTORY 02/28/19:  Shawn Barr is a 49 year old male with a history of obstructive sleep apnea on CPAP.  His CPAP download indicates that he use his machine 26 out of 30 days for compliance of 87%.  He uses machine greater than 4 hours 22 out of 30 days for compliance of 73%.  On average he uses his machine 5 hours and 44 minutes.  His residual AHI is 0.6 on 6 to 14 cm of water.  He does not have a significant leak.  He reports that the CPAP continues to be beneficial.  He denies any new symptoms.  He joins me today for a virtual visit  REVIEW OF SYSTEMS: Out of a complete 14 system review of symptoms, the patient complains only of the following symptoms, and all other reviewed systems are negative.  FSS 18 ESS 4  ALLERGIES: No Known Allergies  HOME MEDICATIONS: Outpatient Medications Prior to Visit  Medication Sig Dispense Refill   albuterol (PROVENTIL HFA;VENTOLIN HFA) 108 (90 BASE) MCG/ACT inhaler Inhale into the lungs every 6 (six) hours as needed for wheezing or shortness of breath.     amLODipine (NORVASC) 5 MG tablet Take 5 mg by mouth daily.     atorvastatin (LIPITOR) 80 MG tablet Take 80 mg by mouth daily.     fexofenadine (ALLEGRA) 180  MG tablet Take 180 mg by mouth daily.     fluticasone-salmeterol (ADVAIR HFA) 115-21 MCG/ACT inhaler Inhale 1 puff into the lungs daily.     loratadine (CLARITIN) 10 MG tablet Take 10 mg by mouth daily.     No facility-administered medications prior to visit.    PAST MEDICAL HISTORY: Past Medical History:  Diagnosis Date   Allergic rhinitis    Broken arm    Left   Broken wrist    Chronic LBP    Daytime somnolence    Elevated LFTs    Fatty liver   Gynecomastia    Hyperlipidemia    Hypertension    Hypertriglyceridemia    Hypothyroidism    OSA (obstructive sleep apnea)    CPAP   Umbilical hernia     PAST SURGICAL HISTORY: Past Surgical History:  Procedure Laterality Date   ANKLE SURGERY  1995   arm surgery  2000's   HAND SURGERY  1992   TONSILLECTOMY  1991    FAMILY HISTORY: Family History  Problem Relation Age of Onset   Hypertension Father    Diabetes Father    Sleep apnea Father    Glaucoma Mother    Diabetes Unknown    Diabetes Unknown     SOCIAL HISTORY: Social History  Socioeconomic History   Marital status: Married    Spouse name: Shawn Barr   Number of children: 1   Years of education: College   Highest education level: Not on file  Occupational History   Not on file  Tobacco Use   Smoking status: Never Smoker   Smokeless tobacco: Never Used  Substance and Sexual Activity   Alcohol use: Yes    Comment: occas.   Drug use: No   Sexual activity: Not on file  Other Topics Concern   Not on file  Social History Narrative   Patient is married Shawn Barr) and lives at home with his wife and one child.   Patient is working full-time.   Patient has a college education.   Patient is right-handed.   Patient drinks 2-3 cups of tea daily.               Social Determinants of Health   Financial Resource Strain:    Difficulty of Paying Living Expenses:   Food Insecurity:    Worried About Charity fundraiser in the  Last Year:    Arboriculturist in the Last Year:   Transportation Needs:    Film/video editor (Medical):    Lack of Transportation (Non-Medical):   Physical Activity:    Days of Exercise per Week:    Minutes of Exercise per Session:   Stress:    Feeling of Stress :   Social Connections:    Frequency of Communication with Friends and Family:    Frequency of Social Gatherings with Friends and Family:    Attends Religious Services:    Active Member of Clubs or Organizations:    Attends Archivist Meetings:    Marital Status:   Intimate Partner Violence:    Fear of Current or Ex-Partner:    Emotionally Abused:    Physically Abused:    Sexually Abused:       PHYSICAL EXAM  Vitals:   02/29/20 0753  BP: (!) 157/95  Pulse: 76  Weight: 262 lb (118.8 kg)  Height: 5\' 9"  (1.753 m)   Body mass index is 38.69 kg/m.  Generalized: Well developed, in no acute distress  Chest: Lungs clear to auscultation bilaterally  Neurological examination  Mentation: Alert oriented to time, place, history taking. Follows all commands speech and language fluent Cranial nerve II-XII: Extraocular movements were full, visual field were full on confrontational test Head turning and shoulder shrug  were normal and symmetric. Motor: The motor testing reveals 5 over 5 strength of all 4 extremities. Good symmetric motor tone is noted throughout.  Sensory: Sensory testing is intact to soft touch on all 4 extremities. No evidence of extinction is noted.  Gait and station: Gait is normal.    DIAGNOSTIC DATA (LABS, IMAGING, TESTING) - I reviewed patient records, labs, notes, testing and imaging myself where available.     ASSESSMENT AND PLAN 49 y.o. year old male  has a past medical history of Allergic rhinitis, Broken arm, Broken wrist, Chronic LBP, Daytime somnolence, Elevated LFTs, Gynecomastia, Hyperlipidemia, Hypertension, Hypertriglyceridemia, Hypothyroidism, OSA  (obstructive sleep apnea), and Umbilical hernia. here with:  1. OSA on CPAP  - CPAP compliance excellent - Good treatment of AHI  - Encourage patient to use CPAP nightly and > 4 hours each night - F/U in 1 year or sooner if needed   I spent 20 minutes of face-to-face and non-face-to-face time with patient.  This included previsit chart review, lab review, study  review, order entry, electronic health record documentation, patient education.  Ward Givens, MSN, NP-C 02/29/2020, 7:58 AM [facility] [address] [place], [place] [address] [phone]

## 2020-02-29 NOTE — Telephone Encounter (Signed)
I called and relayed to pt what I found out.  I gave him the names.  [facility].  I relayed that when he calls and speaks to them he should be able to relay and ask if in network with them.  He verbalized understanding.

## 2020-02-29 NOTE — Patient Instructions (Signed)
Continue using CPAP nightly and greater than 4 hours each night °If your symptoms worsen or you develop new symptoms please let us know.  ° °

## 2020-02-29 NOTE — Telephone Encounter (Signed)
I would just ask the patient which one he prefers

## 2020-02-29 NOTE — Telephone Encounter (Signed)
I called and spoke to stephanie with [facility] [phone] re: needing in network providers for pt and his cpap DME.  She stated that are 4 in the area he lives, [facility], [facility], [facility] ([facility]) and [facility].

## 2020-06-14 ENCOUNTER — Other Ambulatory Visit: Payer: Self-pay | Admitting: Orthopedic Surgery

## 2020-07-03 NOTE — Pre-Procedure Instructions (Signed)
[facility] [place], [place] AT [facility] OF [address] [place] [address] Phone: [phone] Fax: [phone]    Your procedure is scheduled on Wed., Oct. 13, 2021 from 8:30AM-11:30AM  Report to [facility] Entrance "A" at 6:30AM  Call this number if you have problems the morning of surgery:  [phone]   Remember:  Do not eat after midnight on Oct. 12th  You may drink clear liquids until 3 hours (5:30AM) prior to surgery time .  Clear liquids allowed are: Water, Juice (no pulp), Clear Tea (no dairy or creamer), Black Coffee Only (no dairy or creamer), Carbonated Beverages, Gatorade, Plain Jell-O, Plain Popsicles.  Enhanced Recovery after Surgery for Orthopedics Enhanced Recovery after Surgery is a protocol used to improve the stress on your body and your recovery after surgery.  Patient Instructions    The day of surgery (if you do NOT have diabetes):  o Drink ONE (1) Pre-Surgery Clear Ensure by __5:30AM___ the morning of surgery   o This drink was given to you during your hospital  pre-op appointment visit. o Nothing else to drink after completing the  Pre-Surgery Clear Ensure.        If you have questions, please contact your surgeons office.     Take these medicines the morning of surgery with A SIP OF WATER: AmLODipine (NORVASC) Cetirizine (ZYRTEC)       If Needed: Albuterol (PROVENTIL HFA;VENTOLIN HFA)-bring with you day of surgery Fluticasone-salmeterol (ADVAIR HFA)-bring with you day of surgery  As of today, STOP taking all Aspirin (unless instructed by your doctor) and Other Aspirin containing products, Vitamins, Fish oils, and Herbal medications. Also stop all NSAIDS i.e. Advil, Ibuprofen, Motrin, Aleve, Anaprox, Naproxen, BC, Goody Powders, and all Supplements.   No Smoking of any kind, Tobacco/Vaping, or Alcohol products 24 hours prior to your procedure. If you use a Cpap at night, you  may bring all equipment for your overnight stay.    Special instructions:                                                  [facility]- Preparing For Surgery  Before surgery, you can play an important role. Because skin is not sterile, your skin needs to be as free of germs as possible. You can reduce the number of germs on your skin by washing with CHG (chlorahexidine gluconate) Soap before surgery.  CHG is an antiseptic cleaner which kills germs and bonds with the skin to continue killing germs even after washing.    Please do not use if you have an allergy to CHG or antibacterial soaps. If your skin becomes reddened/irritated stop using the CHG.  Do not shave (including legs and underarms) for at least 48 hours prior to first CHG shower. It is OK to shave your face.  Please follow these instructions carefully.   1. Shower the NIGHT BEFORE SURGERY and the MORNING OF SURGERY with CHG.   2. If you chose to wash your hair, wash your hair first as usual with your normal shampoo.  3. After you shampoo, rinse your hair and body thoroughly to remove the shampoo.  4. Use CHG as you would any other liquid soap. You can apply CHG directly to the skin and wash gently with  a scrungie or a clean washcloth.   5. Apply the CHG Soap to your body ONLY FROM THE NECK DOWN.  Do not use on open wounds or open sores. Avoid contact with your eyes, ears, mouth and genitals (private parts). Wash Face and genitals (private parts)  with your normal soap.  6. Wash thoroughly, paying special attention to the area where your surgery will be performed.  7. Thoroughly rinse your body with warm water from the neck down.  8. DO NOT shower/wash with your normal soap after using and rinsing off the CHG Soap.  9. Pat yourself dry with a CLEAN TOWEL.  10. Wear CLEAN PAJAMAS to bed the night before surgery, wear comfortable clothes the morning of surgery  11. Place CLEAN SHEETS on your bed the night of your first  shower and DO NOT SLEEP WITH PETS.   Day of Surgery:             Remember to brush your teeth WITH YOUR REGULAR TOOTHPASTE.  Do not wear jewelry.  Do not wear lotions, powders, colognes, or deodorant.  Do not shave 48 hours prior to surgery.  Men may shave face and neck.  Do not bring valuables to the hospital.  [facility] is not responsible for any belongings or valuables.  Contacts, dentures or bridgework may not be worn into surgery.    For patients admitted to the hospital, discharge time will be determined by your treatment team.  Patients discharged the day of surgery will not be allowed to drive home, and someone age 39 and over needs to stay with them for 24 hours.  Please wear clean clothes to the hospital/surgery center.    Please read over the following fact sheets that you were given.

## 2020-07-04 ENCOUNTER — Other Ambulatory Visit: Payer: Self-pay

## 2020-07-04 ENCOUNTER — Encounter (HOSPITAL_COMMUNITY): Payer: Self-pay

## 2020-07-04 ENCOUNTER — Encounter (HOSPITAL_COMMUNITY)
Admission: RE | Admit: 2020-07-04 | Discharge: 2020-07-04 | Disposition: A | Discharge status: Home / Self Care | Payer: Managed Care, Other (non HMO) | Source: Ambulatory Visit | Attending: Orthopedic Surgery | Admitting: Orthopedic Surgery

## 2020-07-04 DIAGNOSIS — Z01818 Encounter for other preprocedural examination: Secondary | ICD-10-CM | POA: Insufficient documentation

## 2020-07-04 LAB — URINALYSIS, ROUTINE W REFLEX MICROSCOPIC
Bilirubin Urine: NEGATIVE
Glucose, UA: NEGATIVE mg/dL
Hgb urine dipstick: NEGATIVE
Ketones, ur: NEGATIVE mg/dL
Leukocytes,Ua: NEGATIVE
Nitrite: NEGATIVE
Protein, ur: NEGATIVE mg/dL
Specific Gravity, Urine: 1.012 (ref 1.005–1.030)
pH: 5 (ref 5.0–8.0)

## 2020-07-04 LAB — CBC WITH DIFFERENTIAL/PLATELET
Abs Immature Granulocytes: 0.01 10*3/uL (ref 0.00–0.07)
Basophils Absolute: 0.1 10*3/uL (ref 0.0–0.1)
Basophils Relative: 1 %
Eosinophils Absolute: 0.1 10*3/uL (ref 0.0–0.5)
Eosinophils Relative: 2 %
HCT: 46.4 % (ref 39.0–52.0)
Hemoglobin: 14.9 g/dL (ref 13.0–17.0)
Immature Granulocytes: 0 %
Lymphocytes Relative: 40 %
Lymphs Abs: 2 10*3/uL (ref 0.7–4.0)
MCH: 27.3 pg (ref 26.0–34.0)
MCHC: 32.1 g/dL (ref 30.0–36.0)
MCV: 85.1 fL (ref 80.0–100.0)
Monocytes Absolute: 0.5 10*3/uL (ref 0.1–1.0)
Monocytes Relative: 9 %
Neutro Abs: 2.4 10*3/uL (ref 1.7–7.7)
Neutrophils Relative %: 48 %
Platelets: 276 10*3/uL (ref 150–400)
RBC: 5.45 MIL/uL (ref 4.22–5.81)
RDW: 13.2 % (ref 11.5–15.5)
WBC: 5 10*3/uL (ref 4.0–10.5)
nRBC: 0 % (ref 0.0–0.2)

## 2020-07-04 LAB — APTT: aPTT: 30 seconds (ref 24–36)

## 2020-07-04 LAB — TYPE AND SCREEN
ABO/RH(D): A POS
Antibody Screen: NEGATIVE

## 2020-07-04 LAB — PROTIME-INR
INR: 0.9 (ref 0.8–1.2)
Prothrombin Time: 12.2 seconds (ref 11.4–15.2)

## 2020-07-04 LAB — SURGICAL PCR SCREEN
MRSA, PCR: NEGATIVE
Staphylococcus aureus: POSITIVE — AB

## 2020-07-04 NOTE — Progress Notes (Addendum)
PCP - Dr. Fenton Malling with [facility] - Denies  Chest x-ray - Not indicated EKG - 07/04/20 Stress Test - Denies ECHO - 09/20/12 Cardiac Cath - Denies  Sleep Study - Yes has OSA CPAP - Yes nightly  DM - Denies  ERAS Protcol -Yes PRE-SURGERY Ensure given  COVID TEST- 07/07/20  Anesthesia review: No  Patient denies shortness of breath, fever, cough and chest pain at PAT appointment   All instructions explained to the patient, with a verbal understanding of the material. Patient agrees to go over the instructions while at home for a better understanding. Patient also instructed to self quarantine after being tested for COVID-19. The opportunity to ask questions was provided.

## 2020-07-04 NOTE — Progress Notes (Signed)
Received call from lab that the [place] had hemolyzed.  Tried to reach patient about possibly coming back in for another draw for this lab.  Left message on cell phone. May need to collect on DOS.

## 2020-07-07 ENCOUNTER — Other Ambulatory Visit (HOSPITAL_COMMUNITY)
Admission: RE | Admit: 2020-07-07 | Discharge: 2020-07-07 | Disposition: A | Discharge status: Home / Self Care | Payer: Managed Care, Other (non HMO) | Source: Ambulatory Visit | Attending: Orthopedic Surgery | Admitting: Orthopedic Surgery

## 2020-07-07 DIAGNOSIS — Z01812 Encounter for preprocedural laboratory examination: Secondary | ICD-10-CM | POA: Diagnosis present

## 2020-07-07 DIAGNOSIS — Z20822 Contact with and (suspected) exposure to covid-19: Secondary | ICD-10-CM | POA: Insufficient documentation

## 2020-07-07 LAB — SARS CORONAVIRUS 2 (TAT 6-24 HRS): SARS Coronavirus 2: NEGATIVE

## 2020-07-11 ENCOUNTER — Encounter (HOSPITAL_COMMUNITY): Payer: Self-pay | Admitting: Orthopedic Surgery

## 2020-07-11 ENCOUNTER — Encounter (HOSPITAL_COMMUNITY)
Admission: RE | Disposition: A | Discharge status: Home / Self Care | Payer: Self-pay | Source: Home / Self Care | Attending: Orthopedic Surgery

## 2020-07-11 ENCOUNTER — Inpatient Hospital Stay (HOSPITAL_COMMUNITY): Payer: Managed Care, Other (non HMO) | Admitting: Anesthesiology

## 2020-07-11 ENCOUNTER — Inpatient Hospital Stay (HOSPITAL_COMMUNITY)
Admission: RE | Admit: 2020-07-11 | Discharge: 2020-07-13 | DRG: 455 | Disposition: A | Discharge status: Home / Self Care | Payer: Managed Care, Other (non HMO) | Attending: Orthopedic Surgery | Admitting: Orthopedic Surgery

## 2020-07-11 ENCOUNTER — Other Ambulatory Visit: Payer: Self-pay

## 2020-07-11 ENCOUNTER — Inpatient Hospital Stay (HOSPITAL_COMMUNITY): Payer: Managed Care, Other (non HMO)

## 2020-07-11 DIAGNOSIS — E781 Pure hyperglyceridemia: Secondary | ICD-10-CM | POA: Diagnosis present

## 2020-07-11 DIAGNOSIS — Z79899 Other long term (current) drug therapy: Secondary | ICD-10-CM

## 2020-07-11 DIAGNOSIS — Z91011 Allergy to milk products: Secondary | ICD-10-CM | POA: Diagnosis not present

## 2020-07-11 DIAGNOSIS — E785 Hyperlipidemia, unspecified: Secondary | ICD-10-CM | POA: Diagnosis present

## 2020-07-11 DIAGNOSIS — I10 Essential (primary) hypertension: Secondary | ICD-10-CM | POA: Diagnosis present

## 2020-07-11 DIAGNOSIS — M48061 Spinal stenosis, lumbar region without neurogenic claudication: Secondary | ICD-10-CM | POA: Diagnosis present

## 2020-07-11 DIAGNOSIS — E039 Hypothyroidism, unspecified: Secondary | ICD-10-CM | POA: Diagnosis present

## 2020-07-11 DIAGNOSIS — M4316 Spondylolisthesis, lumbar region: Secondary | ICD-10-CM | POA: Diagnosis present

## 2020-07-11 DIAGNOSIS — M545 Low back pain, unspecified: Secondary | ICD-10-CM | POA: Diagnosis present

## 2020-07-11 DIAGNOSIS — J309 Allergic rhinitis, unspecified: Secondary | ICD-10-CM | POA: Diagnosis present

## 2020-07-11 DIAGNOSIS — Z6838 Body mass index (BMI) 38.0-38.9, adult: Secondary | ICD-10-CM

## 2020-07-11 DIAGNOSIS — Z91010 Allergy to peanuts: Secondary | ICD-10-CM

## 2020-07-11 DIAGNOSIS — G4733 Obstructive sleep apnea (adult) (pediatric): Secondary | ICD-10-CM | POA: Diagnosis present

## 2020-07-11 DIAGNOSIS — Z8249 Family history of ischemic heart disease and other diseases of the circulatory system: Secondary | ICD-10-CM

## 2020-07-11 DIAGNOSIS — G8929 Other chronic pain: Secondary | ICD-10-CM | POA: Diagnosis present

## 2020-07-11 DIAGNOSIS — M5410 Radiculopathy, site unspecified: Secondary | ICD-10-CM | POA: Diagnosis present

## 2020-07-11 DIAGNOSIS — M5416 Radiculopathy, lumbar region: Secondary | ICD-10-CM | POA: Diagnosis present

## 2020-07-11 DIAGNOSIS — E669 Obesity, unspecified: Secondary | ICD-10-CM | POA: Diagnosis present

## 2020-07-11 DIAGNOSIS — K760 Fatty (change of) liver, not elsewhere classified: Secondary | ICD-10-CM | POA: Diagnosis present

## 2020-07-11 DIAGNOSIS — Z419 Encounter for procedure for purposes other than remedying health state, unspecified: Secondary | ICD-10-CM

## 2020-07-11 HISTORY — PX: ANTERIOR LAT LUMBAR FUSION: SHX1168

## 2020-07-11 LAB — COMPREHENSIVE METABOLIC PANEL
ALT: 52 U/L — ABNORMAL HIGH (ref 0–44)
AST: 47 U/L — ABNORMAL HIGH (ref 15–41)
Albumin: 3.8 g/dL (ref 3.5–5.0)
Alkaline Phosphatase: 61 U/L (ref 38–126)
Anion gap: 10 (ref 5–15)
BUN: 5 mg/dL — ABNORMAL LOW (ref 6–20)
CO2: 25 mmol/L (ref 22–32)
Calcium: 10.4 mg/dL — ABNORMAL HIGH (ref 8.9–10.3)
Chloride: 103 mmol/L (ref 98–111)
Creatinine, Ser: 0.59 mg/dL — ABNORMAL LOW (ref 0.61–1.24)
GFR, Estimated: >60 mL/min (ref >60)
Glucose, Bld: 152 mg/dL — ABNORMAL HIGH (ref 70–99)
Potassium: 3.3 mmol/L — ABNORMAL LOW (ref 3.5–5.1)
Sodium: 138 mmol/L (ref 135–145)
Total Bilirubin: 0.8 mg/dL (ref 0.3–1.2)
Total Protein: 6.6 g/dL (ref 6.5–8.1)

## 2020-07-11 LAB — ABO/RH: ABO/RH(D): A POS

## 2020-07-11 SURGERY — ANTERIOR LATERAL LUMBAR FUSION 1 LEVEL
Anesthesia: General | Site: Spine Lumbar | Laterality: Right

## 2020-07-11 MED ORDER — THROMBIN 20000 UNITS EX SOLR
CUTANEOUS | Status: DC | PRN
  Administered 2020-07-11: 20 mL via TOPICAL

## 2020-07-11 MED ORDER — METHOCARBAMOL 500 MG PO TABS
500.0000 mg | ORAL_TABLET | Freq: Four times a day (QID) | ORAL | Status: DC | PRN
  Administered 2020-07-11 – 2020-07-13 (×4): 500 mg via ORAL
  Filled 2020-07-11 (×4): qty 1

## 2020-07-11 MED ORDER — ALBUTEROL SULFATE (2.5 MG/3ML) 0.083% IN NEBU: 2.5000 mg | INHALATION_SOLUTION | Freq: Four times a day (QID) | RESPIRATORY_TRACT | Status: DC | PRN

## 2020-07-11 MED ORDER — LORATADINE 10 MG PO TABS: 10.0000 mg | ORAL_TABLET | Freq: Every day | ORAL | Status: DC

## 2020-07-11 MED ORDER — LACTATED RINGERS IV SOLN: INTRAVENOUS | Status: DC | PRN

## 2020-07-11 MED ORDER — ALUM & MAG HYDROXIDE-SIMETH 200-200-20 MG/5ML PO SUSP: 30.0000 mL | Freq: Four times a day (QID) | ORAL | Status: DC | PRN

## 2020-07-11 MED ORDER — OXYCODONE HCL 5 MG/5ML PO SOLN: 5.0000 mg | Freq: Once | ORAL | Status: DC | PRN

## 2020-07-11 MED ORDER — FLEET ENEMA 7-19 GM/118ML RE ENEM: 1.0000 | ENEMA | Freq: Once | RECTAL | Status: DC | PRN

## 2020-07-11 MED ORDER — MORPHINE SULFATE (PF) 2 MG/ML IV SOLN
1.0000 mg | INTRAVENOUS | Status: DC | PRN
  Administered 2020-07-11 – 2020-07-12 (×3): 2 mg via INTRAVENOUS
  Filled 2020-07-11 (×3): qty 1

## 2020-07-11 MED ORDER — SODIUM CHLORIDE 0.9 % IV SOLN: 250.0000 mL | INTRAVENOUS | Status: DC

## 2020-07-11 MED ORDER — FENTANYL CITRATE (PF) 100 MCG/2ML IJ SOLN
25.0000 ug | INTRAMUSCULAR | Status: DC | PRN
  Administered 2020-07-11: 25 ug via INTRAVENOUS

## 2020-07-11 MED ORDER — ONDANSETRON HCL 4 MG/2ML IJ SOLN: 4.0000 mg | Freq: Once | INTRAMUSCULAR | Status: DC | PRN

## 2020-07-11 MED ORDER — ENSURE PRE-SURGERY PO LIQD
296.0000 mL | Freq: Once | ORAL | Status: AC
  Administered 2020-07-12: 296 mL via ORAL
  Filled 2020-07-11: qty 296

## 2020-07-11 MED ORDER — PHENOL 1.4 % MT LIQD: 1.0000 | OROMUCOSAL | Status: DC | PRN

## 2020-07-11 MED ORDER — CEFAZOLIN SODIUM-DEXTROSE 2-4 GM/100ML-% IV SOLN
2.0000 g | Freq: Three times a day (TID) | INTRAVENOUS | Status: AC
  Administered 2020-07-11 (×2): 2 g via INTRAVENOUS
  Filled 2020-07-11 (×2): qty 100

## 2020-07-11 MED ORDER — ACETAMINOPHEN 10 MG/ML IV SOLN
INTRAVENOUS | Status: DC | PRN
  Administered 2020-07-11: 1000 mg via INTRAVENOUS

## 2020-07-11 MED ORDER — ALBUTEROL SULFATE HFA 108 (90 BASE) MCG/ACT IN AERS: 1.0000 | INHALATION_SPRAY | Freq: Four times a day (QID) | RESPIRATORY_TRACT | Status: DC | PRN

## 2020-07-11 MED ORDER — OXYCODONE-ACETAMINOPHEN 5-325 MG PO TABS
1.0000 | ORAL_TABLET | ORAL | Status: DC | PRN
  Administered 2020-07-11 – 2020-07-13 (×6): 2 via ORAL
  Filled 2020-07-11 (×6): qty 2

## 2020-07-11 MED ORDER — POVIDONE-IODINE 7.5 % EX SOLN
Freq: Once | CUTANEOUS | Status: DC
  Filled 2020-07-11: qty 118

## 2020-07-11 MED ORDER — PROPOFOL 10 MG/ML IV BOLUS
INTRAVENOUS | Status: AC
  Filled 2020-07-11: qty 20

## 2020-07-11 MED ORDER — MIDAZOLAM HCL 2 MG/2ML IJ SOLN
INTRAMUSCULAR | Status: AC
  Filled 2020-07-11: qty 2

## 2020-07-11 MED ORDER — SUCCINYLCHOLINE CHLORIDE 200 MG/10ML IV SOSY
PREFILLED_SYRINGE | INTRAVENOUS | Status: DC | PRN
  Administered 2020-07-11: 140 mg via INTRAVENOUS

## 2020-07-11 MED ORDER — PROPOFOL 500 MG/50ML IV EMUL
INTRAVENOUS | Status: DC | PRN
  Administered 2020-07-11: 140 ug/kg/min via INTRAVENOUS
  Administered 2020-07-11: 200 ug/kg/min via INTRAVENOUS
  Administered 2020-07-11: 125 ug/kg/min via INTRAVENOUS

## 2020-07-11 MED ORDER — LACTATED RINGERS IV SOLN: INTRAVENOUS | Status: DC

## 2020-07-11 MED ORDER — FENTANYL CITRATE (PF) 250 MCG/5ML IJ SOLN
INTRAMUSCULAR | Status: AC
  Filled 2020-07-11: qty 5

## 2020-07-11 MED ORDER — FENTANYL CITRATE (PF) 100 MCG/2ML IJ SOLN
INTRAMUSCULAR | Status: AC
  Administered 2020-07-11: 50 ug via INTRAVENOUS
  Filled 2020-07-11: qty 2

## 2020-07-11 MED ORDER — BUPIVACAINE-EPINEPHRINE (PF) 0.25% -1:200000 IJ SOLN
INTRAMUSCULAR | Status: AC
  Filled 2020-07-11: qty 30

## 2020-07-11 MED ORDER — LIDOCAINE 2% (20 MG/ML) 5 ML SYRINGE
INTRAMUSCULAR | Status: DC | PRN
  Administered 2020-07-11: 40 mg via INTRAVENOUS

## 2020-07-11 MED ORDER — DOCUSATE SODIUM 100 MG PO CAPS
100.0000 mg | ORAL_CAPSULE | Freq: Two times a day (BID) | ORAL | Status: DC
  Administered 2020-07-11 – 2020-07-12 (×2): 100 mg via ORAL
  Filled 2020-07-11 (×2): qty 1

## 2020-07-11 MED ORDER — PHENYLEPHRINE HCL-NACL 10-0.9 MG/250ML-% IV SOLN
INTRAVENOUS | Status: DC | PRN
  Administered 2020-07-11: 20 ug/min via INTRAVENOUS

## 2020-07-11 MED ORDER — MENTHOL 3 MG MT LOZG: 1.0000 | LOZENGE | OROMUCOSAL | Status: DC | PRN

## 2020-07-11 MED ORDER — ONDANSETRON HCL 4 MG/2ML IJ SOLN: 4.0000 mg | Freq: Four times a day (QID) | INTRAMUSCULAR | Status: DC | PRN

## 2020-07-11 MED ORDER — KETAMINE HCL 10 MG/ML IJ SOLN
INTRAMUSCULAR | Status: DC | PRN
  Administered 2020-07-11: 30 mg via INTRAVENOUS
  Administered 2020-07-11: 20 mg via INTRAVENOUS

## 2020-07-11 MED ORDER — MIDAZOLAM HCL 5 MG/5ML IJ SOLN
INTRAMUSCULAR | Status: DC | PRN
  Administered 2020-07-11 (×2): 2 mg via INTRAVENOUS

## 2020-07-11 MED ORDER — METHOCARBAMOL 1000 MG/10ML IJ SOLN
500.0000 mg | Freq: Four times a day (QID) | INTRAVENOUS | Status: DC | PRN
  Filled 2020-07-11: qty 5

## 2020-07-11 MED ORDER — PHENYLEPHRINE HCL-NACL 10-0.9 MG/250ML-% IV SOLN
INTRAVENOUS | Status: AC
  Filled 2020-07-11: qty 750

## 2020-07-11 MED ORDER — PROPOFOL 10 MG/ML IV BOLUS
INTRAVENOUS | Status: DC | PRN
  Administered 2020-07-11: 200 mg via INTRAVENOUS
  Administered 2020-07-11 (×2): 50 mg via INTRAVENOUS

## 2020-07-11 MED ORDER — POTASSIUM CHLORIDE IN NACL 20-0.9 MEQ/L-% IV SOLN
INTRAVENOUS | Status: DC
  Filled 2020-07-11: qty 1000

## 2020-07-11 MED ORDER — THROMBIN 20000 UNITS EX SOLR
CUTANEOUS | Status: AC
  Filled 2020-07-11: qty 20000

## 2020-07-11 MED ORDER — ORAL CARE MOUTH RINSE: 15.0000 mL | Freq: Once | OROMUCOSAL | Status: AC

## 2020-07-11 MED ORDER — ONDANSETRON HCL 4 MG/2ML IJ SOLN
INTRAMUSCULAR | Status: AC
  Filled 2020-07-11: qty 2

## 2020-07-11 MED ORDER — CEFAZOLIN SODIUM-DEXTROSE 2-4 GM/100ML-% IV SOLN
2.0000 g | INTRAVENOUS | Status: DC
  Filled 2020-07-11: qty 100

## 2020-07-11 MED ORDER — SODIUM CHLORIDE 0.9 % IV SOLN
0.0500 ug/kg/min | INTRAVENOUS | Status: AC
  Administered 2020-07-11: .05 ug/kg/min via INTRAVENOUS
  Filled 2020-07-11: qty 5000

## 2020-07-11 MED ORDER — HYDROCHLOROTHIAZIDE 12.5 MG PO CAPS
12.5000 mg | ORAL_CAPSULE | Freq: Every day | ORAL | Status: DC
  Administered 2020-07-12: 12.5 mg via ORAL
  Filled 2020-07-11: qty 1

## 2020-07-11 MED ORDER — 0.9 % SODIUM CHLORIDE (POUR BTL) OPTIME
TOPICAL | Status: DC | PRN
  Administered 2020-07-11 (×2): 1000 mL

## 2020-07-11 MED ORDER — ACETAMINOPHEN 650 MG RE SUPP: 650.0000 mg | RECTAL | Status: DC | PRN

## 2020-07-11 MED ORDER — KETAMINE HCL 50 MG/5ML IJ SOSY
PREFILLED_SYRINGE | INTRAMUSCULAR | Status: AC
  Filled 2020-07-11: qty 5

## 2020-07-11 MED ORDER — SENNOSIDES-DOCUSATE SODIUM 8.6-50 MG PO TABS
1.0000 | ORAL_TABLET | Freq: Every evening | ORAL | Status: DC | PRN
  Administered 2020-07-12: 1 via ORAL
  Filled 2020-07-11: qty 1

## 2020-07-11 MED ORDER — BISACODYL 5 MG PO TBEC: 5.0000 mg | DELAYED_RELEASE_TABLET | Freq: Every day | ORAL | Status: DC | PRN

## 2020-07-11 MED ORDER — ACETAMINOPHEN 325 MG PO TABS
650.0000 mg | ORAL_TABLET | ORAL | Status: DC | PRN
  Administered 2020-07-11: 650 mg via ORAL
  Filled 2020-07-11: qty 2

## 2020-07-11 MED ORDER — PROPOFOL 1000 MG/100ML IV EMUL
INTRAVENOUS | Status: AC
  Filled 2020-07-11: qty 400

## 2020-07-11 MED ORDER — ONDANSETRON HCL 4 MG PO TABS: 4.0000 mg | ORAL_TABLET | Freq: Four times a day (QID) | ORAL | Status: DC | PRN

## 2020-07-11 MED ORDER — ACETAMINOPHEN 10 MG/ML IV SOLN
INTRAVENOUS | Status: AC
  Filled 2020-07-11: qty 100

## 2020-07-11 MED ORDER — ZOLPIDEM TARTRATE 5 MG PO TABS: 5.0000 mg | ORAL_TABLET | Freq: Every evening | ORAL | Status: DC | PRN

## 2020-07-11 MED ORDER — GLYCOPYRROLATE PF 0.2 MG/ML IJ SOSY
PREFILLED_SYRINGE | INTRAMUSCULAR | Status: AC
  Filled 2020-07-11: qty 1

## 2020-07-11 MED ORDER — SODIUM CHLORIDE 0.9% FLUSH
3.0000 mL | Freq: Two times a day (BID) | INTRAVENOUS | Status: DC
  Administered 2020-07-11 – 2020-07-12 (×3): 3 mL via INTRAVENOUS

## 2020-07-11 MED ORDER — SODIUM CHLORIDE 0.9% FLUSH: 3.0000 mL | INTRAVENOUS | Status: DC | PRN

## 2020-07-11 MED ORDER — ONDANSETRON HCL 4 MG/2ML IJ SOLN
INTRAMUSCULAR | Status: DC | PRN
  Administered 2020-07-11: 4 mg via INTRAVENOUS

## 2020-07-11 MED ORDER — HYDRALAZINE HCL 10 MG PO TABS
25.0000 mg | ORAL_TABLET | Freq: Every day | ORAL | Status: DC
  Administered 2020-07-11 – 2020-07-12 (×2): 25 mg via ORAL
  Filled 2020-07-11: qty 3
  Filled 2020-07-11: qty 1

## 2020-07-11 MED ORDER — GLYCOPYRROLATE PF 0.2 MG/ML IJ SOSY
PREFILLED_SYRINGE | INTRAMUSCULAR | Status: DC | PRN
  Administered 2020-07-11: .2 mg via INTRAVENOUS

## 2020-07-11 MED ORDER — CEFAZOLIN SODIUM-DEXTROSE 2-4 GM/100ML-% IV SOLN
2.0000 g | INTRAVENOUS | Status: AC
  Administered 2020-07-12: 2 g via INTRAVENOUS

## 2020-07-11 MED ORDER — BUPIVACAINE-EPINEPHRINE 0.25% -1:200000 IJ SOLN
INTRAMUSCULAR | Status: DC | PRN
  Administered 2020-07-11: 9 mL

## 2020-07-11 MED ORDER — CHLORHEXIDINE GLUCONATE 0.12 % MT SOLN
15.0000 mL | Freq: Once | OROMUCOSAL | Status: AC
  Administered 2020-07-11: 15 mL via OROMUCOSAL
  Filled 2020-07-11: qty 15

## 2020-07-11 MED ORDER — OXYCODONE HCL 5 MG PO TABS: 5.0000 mg | ORAL_TABLET | Freq: Once | ORAL | Status: DC | PRN

## 2020-07-11 MED ORDER — AMLODIPINE BESYLATE 5 MG PO TABS
5.0000 mg | ORAL_TABLET | Freq: Every day | ORAL | Status: DC
  Administered 2020-07-12: 5 mg via ORAL
  Filled 2020-07-11: qty 1

## 2020-07-11 MED ORDER — MOMETASONE FURO-FORMOTEROL FUM 200-5 MCG/ACT IN AERO: 2.0000 | INHALATION_SPRAY | Freq: Two times a day (BID) | RESPIRATORY_TRACT | Status: DC

## 2020-07-11 MED ORDER — FENTANYL CITRATE (PF) 250 MCG/5ML IJ SOLN
INTRAMUSCULAR | Status: DC | PRN
Start: 2020-07-11 — End: 2020-07-11
  Administered 2020-07-11 (×2): 100 ug via INTRAVENOUS

## 2020-07-11 SURGICAL SUPPLY — 72 items
BATTALION LLIF ITRADISCAL SHIM (MISCELLANEOUS) ×3
BENZOIN TINCTURE PRP APPL 2/3 (GAUZE/BANDAGES/DRESSINGS) ×3 IMPLANT
BLADE CLIPPER SURG (BLADE) ×3 IMPLANT
BLADE SURG 10 STRL SS (BLADE) ×3 IMPLANT
BONE VIVIGEN FORMABLE 5.4CC (Bone Implant) ×6 IMPLANT
CLIP SPRING STIM LLIF SAFEOP (CLIP) ×3 IMPLANT
CLOSURE WOUND 1/2 X4 (GAUZE/BANDAGES/DRESSINGS) ×1
COVER BACK TABLE 80X110 HD (DRAPES) ×3 IMPLANT
COVER SURGICAL LIGHT HANDLE (MISCELLANEOUS) ×3 IMPLANT
COVER WAND RF STERILE (DRAPES) IMPLANT
DILATOR INSULATED LLIF 8-13-18 (NEUROSURGERY SUPPLIES) ×3 IMPLANT
DRAPE C-ARM 42X72 X-RAY (DRAPES) ×3 IMPLANT
DRAPE C-ARMOR (DRAPES) ×3 IMPLANT
DRAPE POUCH INSTRU U-SHP 10X18 (DRAPES) ×3 IMPLANT
DRAPE SURG 17X23 STRL (DRAPES) ×12 IMPLANT
DURAPREP 26ML APPLICATOR (WOUND CARE) ×3 IMPLANT
ELECT BLADE 6.5 EXT (BLADE) ×3 IMPLANT
ELECT CAUTERY BLADE 6.4 (BLADE) ×6 IMPLANT
ELECT KIT SAFEOP EMG/NMJ (ELECTRODE)
ELECT KIT SAFEOP EMG/NMJ (KITS) ×3
ELECT REM PT RETURN 9FT ADLT (ELECTROSURGICAL) ×3
ELECTRODE REM PT RTRN 9FT ADLT (ELECTROSURGICAL) ×1 IMPLANT
GAUZE 4X4 16PLY RFD (DISPOSABLE) ×3 IMPLANT
GAUZE SPONGE 4X4 12PLY STRL (GAUZE/BANDAGES/DRESSINGS) ×3 IMPLANT
GLOVE BIO SURGEON STRL SZ7 (GLOVE) ×6 IMPLANT
GLOVE BIO SURGEON STRL SZ8 (GLOVE) ×3 IMPLANT
GLOVE BIOGEL PI IND STRL 7.0 (GLOVE) ×1 IMPLANT
GLOVE BIOGEL PI IND STRL 8 (GLOVE) ×1 IMPLANT
GLOVE BIOGEL PI INDICATOR 7.0 (GLOVE) ×2
GLOVE BIOGEL PI INDICATOR 8 (GLOVE) ×2
GOWN STRL REUS W/ TWL LRG LVL3 (GOWN DISPOSABLE) ×1 IMPLANT
GOWN STRL REUS W/ TWL XL LVL3 (GOWN DISPOSABLE) ×2 IMPLANT
GOWN STRL REUS W/TWL LRG LVL3 (GOWN DISPOSABLE) ×3
GOWN STRL REUS W/TWL XL LVL3 (GOWN DISPOSABLE) ×4
GUIDEWIRE LLIF TT 320 (WIRE) ×3 IMPLANT
IV CATH 14GX2 1/4 (CATHETERS) ×3 IMPLANT
KIT BASIN OR (CUSTOM PROCEDURE TRAY) ×3 IMPLANT
KIT EMG SRFC ELECT (KITS) ×1 IMPLANT
KIT EMG SRFC ELECT SAFEOP (ELECTRODE) IMPLANT
KIT TURNOVER KIT B (KITS) ×3 IMPLANT
KNIFE ANNULOTOMY (BLADE) ×3 IMPLANT
MARKER SKIN DUAL TIP RULER LAB (MISCELLANEOUS) ×3 IMPLANT
NEEDLE HYPO 25GX1X1/2 BEV (NEEDLE) ×3 IMPLANT
NEEDLE SPNL 18GX3.5 QUINCKE PK (NEEDLE) ×3 IMPLANT
NS IRRIG 1000ML POUR BTL (IV SOLUTION) ×6 IMPLANT
PACK LAMINECTOMY ORTHO (CUSTOM PROCEDURE TRAY) ×3 IMPLANT
PACK UNIVERSAL I (CUSTOM PROCEDURE TRAY) ×3 IMPLANT
PAD ARMBOARD 7.5X6 YLW CONV (MISCELLANEOUS) ×6 IMPLANT
PLATE LIF AMP 2/SCRW 10/15 (Plate) ×3 IMPLANT
PROBE BALL TIP LLIF SAFEOP (NEUROSURGERY SUPPLIES) ×3 IMPLANT
SCREW LIF AMP 5.5X60 (Screw) ×6 IMPLANT
SHIM ITRADISCAL BATTALION LLIF (MISCELLANEOUS) ×1 IMPLANT
SPACER LLIF BATT 14X60X18 0D (Spacer) ×3 IMPLANT
SPONGE INTESTINAL PEANUT (DISPOSABLE) ×6 IMPLANT
SPONGE LAP 4X18 RFD (DISPOSABLE) ×3 IMPLANT
SPONGE SURGIFOAM ABS GEL 100 (HEMOSTASIS) IMPLANT
STAPLER VISISTAT 35W (STAPLE) ×3 IMPLANT
STRIP CLOSURE SKIN 1/2X4 (GAUZE/BANDAGES/DRESSINGS) ×2 IMPLANT
SURGIFLO W/THROMBIN 8M KIT (HEMOSTASIS) IMPLANT
SUT MNCRL AB 4-0 PS2 18 (SUTURE) ×3 IMPLANT
SUT VIC AB 0 CT1 18XCR BRD 8 (SUTURE) IMPLANT
SUT VIC AB 0 CT1 8-18 (SUTURE)
SUT VIC AB 1 CT1 18XCR BRD 8 (SUTURE) ×1 IMPLANT
SUT VIC AB 1 CT1 8-18 (SUTURE) ×2
SUT VIC AB 2-0 CT2 18 VCP726D (SUTURE) ×6 IMPLANT
SYR BULB IRRIG 60ML STRL (SYRINGE) ×3 IMPLANT
SYR CONTROL 10ML LL (SYRINGE) ×3 IMPLANT
TOWEL GREEN STERILE (TOWEL DISPOSABLE) ×3 IMPLANT
TOWEL GREEN STERILE FF (TOWEL DISPOSABLE) ×3 IMPLANT
TRAY FOLEY MTR SLVR 16FR STAT (SET/KITS/TRAYS/PACK) ×3 IMPLANT
WATER STERILE IRR 1000ML POUR (IV SOLUTION) IMPLANT
YANKAUER SUCT BULB TIP NO VENT (SUCTIONS) ×3 IMPLANT

## 2020-07-11 NOTE — Op Note (Signed)
PATIENT NAME: Shawn Barr   MEDICAL RECORD NO.:   [id number]    DATE OF BIRTH: Apr 26, 1971   DATE OF PROCEDURE: 07/11/2020                               OPERATIVE REPORT   PREOPERATIVE DIAGNOSES: 1.  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  Bilateral L4 pars defect 3.  Grade 1 L4/5 spondylolisthesis   POSTOPERATIVE DIAGNOSES: 1.  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  Bilateral L4 pars defect 3.  Grade 1 L4/5 spondylolisthesis   PROCEDURE (Stage 1 of 2):  1.  Right-sided lateral interbody fusion via direct lateral retroperitoneal approach. 2.  Insertion of interbody device x1 (14 x 18 mm x 60 mm Alphatec intervertebral spacer). 3.  Placement of anterior instrumentation, L4-5 (34mm lateral plate and 24PY screws) 4.  Use of morselized allograft -- ViviGen.   5.  Intraoperative use of fluoroscopy.  SURGEON:  Phylliss Bob, MD  ASSISTANT:  Pricilla Holm PA-C.  ANESTHESIA:  General endotracheal anesthesia.  COMPLICATIONS:  None.  DISPOSITION:  Stable.  ESTIMATED BLOOD LOSS:  Minimal.  INDICATIONS:  Briefly, Mr. Klich is a very pleasant 49 year old male who did present to me with ongoing pain in the bilateral legs due to the findings outlined above. Given the patient's ongoing symptoms, and lack of improvement with appropriate conservative treatment measures, I did discuss proceeding with a 2-stage procedure, starting with a direct lateral interbody fusion as reflected above.  The patient was fully aware of the risks and limitations of surgery, and did wish to proceed.  DESCRIPTION OF PROCEDURE:  On 07/11/2020 the patient was brought to surgery and general endotracheal anesthesia was administered.  The patient was placed in the lateral decubitus position, with the right side up.  Neurologic monitoring leads were placed by the monitoring technician.  The patient's torso and lower extremities were secured to the bed.  The patient's hips and  knees were flexed in order to lessen the tension on the psoas musculature.  The right flank was then prepped and draped in the usual sterile fashion.  The bed was flexed, in order to optimize exposure to the L4-5 intervertebral space.  After a timeout procedure was performed, a right-sided transverse incision was made over the right flank overlying the L4-5 intervertebral space.  The superior aspect of the iliac crest was in the region of the L4-5 intervertebral space.  I did need to dissect just above the iliac crest, and through the external and internal oblique musculature, and transversalis musculature and fascia.  The retroperitoneal space was encountered.  The peritoneum was bluntly swept anteriorly, and the psoas was readily identified.  I did use a series of dilators to dock over the L4-5 intervertebral space.  I did use neurologic monitoring while placing the dilators, in order to ensure that there were no neurologic structures in the immediate vicinity of the dilators.  The lumbar plexus was noted to be posterior.  A self-retaining retractor was placed, and was attached to a rigid arm.  The retractor was very gently dilated and a shim was placed into the L4/5 disc space.  I then used a knife to perform an annulotomy at the lateral aspect of the L4-5 intervertebral space.  I then used a series of curettes and pituitary rongeurs in order to perform a thorough and complete L4-5 intervertebral diskectomy.  The contralateral annulus was  released.  I then placed a series of intervertebral spacer trials, and I did feel that a 14 x 18 mm x 60 mm spacer would be the most appropriate fit. The appropriate spacer was then packed with ViviGen and tamped into position.  I was very pleased with the final resting position of the intervertebral spacer.  At this point, a 24 mm plate was placed over the lateral aspect of the L4 and L5 vertebral bodies.  I then used an awl to prepare the trajectory of the L4 and L5 vertebral  body screws.  A 60 mm screw was placed into the L4 vertebral body, and a 60 mm screw was placed into the L5 vertebral body.  The screws were then locked to the plate.  The break in the bed was removed and the bed was flattened and the plate was then locked.  I was very pleased with the final AP and lateral fluoroscopic images. Of note, I did use neurologic monitoring throughout the entire surgery, and there was no sustained EMG activity noted throughout the entire surgery. At this point, the wound was copiously irrigated.  The fascia, internal, and external oblique musculature was closed using #1 Vicryl.  The subcutaneous layer was closed using 2-0 Vicryl and the skin was closed using 4-0 Monocryl.  Benzoin and Steri-Strips were applied followed by sterile dressing.  All instrument counts were correct at the termination of the procedure.  Of note, Pricilla Holm was my assistant throughout surgery, and did aid in retraction, suctioning, placement of the hardware, and closure.  Phylliss Bob, MD

## 2020-07-11 NOTE — Anesthesia Procedure Notes (Signed)
Procedure Name: Intubation Date/Time: 07/11/2020 8:40 AM Performed by: Renato Shin, CRNA Pre-anesthesia Checklist: Patient identified, Emergency Drugs available, Suction available and Patient being monitored Patient Re-evaluated:Patient Re-evaluated prior to induction Oxygen Delivery Method: Circle system utilized Preoxygenation: Pre-oxygenation with 100% oxygen Induction Type: IV induction Ventilation: Mask ventilation without difficulty Laryngoscope Size: Glidescope and 4 Grade View: Grade I Tube type: Oral Tube size: 7.5 mm Number of attempts: 2 Airway Equipment and Method: Stylet and Oral airway Placement Confirmation: ETT inserted through vocal cords under direct vision,  positive ETCO2 and breath sounds checked- equal and bilateral Secured at: 21 cm Tube secured with: Tape Dental Injury: Teeth and Oropharynx as per pre-operative assessment  Difficulty Due To: Difficulty was anticipated and Difficult Airway- due to large tongue Future Recommendations: Recommend- induction with short-acting agent, and alternative techniques readily available Comments: DL x1, Mil 3, Grade 4 view. EZ mask with OA. DL x1; Glide 4; glottic opening visualized. Significant amount of redundant tissue noted in superior oropharynx.

## 2020-07-11 NOTE — OR Nursing (Signed)
NO FOREIGN OBJECT SEEN PER RADIOLOGIST

## 2020-07-11 NOTE — H&P (Signed)
PREOPERATIVE H&P  Chief Complaint: Bilateral leg pain  HPI: Shawn Barr is a 49 y.o. male who presents with ongoing pain in the bilateral legs  MRI reveals spinal stenosis at L4/5 with a pars defect and instability  Patient has failed multiple forms of conservative care and continues to have pain (see office notes for additional details regarding the patient's full course of treatment)  Past Medical History:  Diagnosis Date  . Allergic rhinitis   . Broken arm    Left  . Broken wrist   . Chronic LBP   . Daytime somnolence   . Elevated LFTs    Fatty liver  . Gynecomastia   . Hyperlipidemia   . Hypertension   . Hypertriglyceridemia   . Hypothyroidism   . OSA (obstructive sleep apnea)    CPAP  . Umbilical hernia    Past Surgical History:  Procedure Laterality Date  . ANKLE SURGERY  1995  . arm surgery  2000's  . HAND SURGERY  1992  . TONSILLECTOMY  1991   Social History   Socioeconomic History  . Marital status: Married    Spouse name: [name]  . Number of children: 1  . Years of education: College  . Highest education level: Not on file  Occupational History  . Not on file  Tobacco Use  . Smoking status: Never Smoker  . Smokeless tobacco: Never Used  Vaping Use  . Vaping Use: Never used  Substance and Sexual Activity  . Alcohol use: Yes    Comment: occas.  . Drug use: No  . Sexual activity: Yes  Other Topics Concern  . Not on file  Social History Narrative   Patient is married [name]) and lives at home with his wife and one child.   Patient is working full-time.   Patient has a college education.   Patient is right-handed.   Patient drinks 2-3 cups of tea daily.               Social Determinants of Health   Financial Resource Strain:   . Difficulty of Paying Living Expenses: Not on file  Food Insecurity:   . Worried About Charity fundraiser in the Last Year: Not on file  . Ran Out of Food in the Last Year: Not on file    Transportation Needs:   . Lack of Transportation (Medical): Not on file  . Lack of Transportation (Non-Medical): Not on file  Physical Activity:   . Days of Exercise per Week: Not on file  . Minutes of Exercise per Session: Not on file  Stress:   . Feeling of Stress : Not on file  Social Connections:   . Frequency of Communication with Friends and Family: Not on file  . Frequency of Social Gatherings with Friends and Family: Not on file  . Attends Religious Services: Not on file  . Active Member of Clubs or Organizations: Not on file  . Attends Archivist Meetings: Not on file  . Marital Status: Not on file   Family History  Problem Relation Age of Onset  . Hypertension Father   . Diabetes Father   . Sleep apnea Father   . Glaucoma Mother   . Diabetes Other   . Diabetes Other    Allergies  Allergen Reactions  . Milk-Related Compounds Diarrhea  . Peanut-Containing Drug Products    Prior to Admission medications   Medication Sig Start Date End Date Taking? Authorizing Provider  amLODipine (NORVASC) 5 MG tablet Take 5 mg by mouth daily. 02/09/17  Yes [provider]  cetirizine (ZYRTEC) 10 MG tablet Take 10 mg by mouth daily.   Yes [provider]  hydrALAZINE (APRESOLINE) 25 MG tablet Take 25 mg by mouth at bedtime. 04/30/20  Yes [provider]  hydrochlorothiazide (MICROZIDE) 12.5 MG capsule Take 12.5 mg by mouth daily. 04/28/20  Yes [provider]  albuterol (PROVENTIL HFA;VENTOLIN HFA) 108 (90 BASE) MCG/ACT inhaler Inhale 1-2 puffs into the lungs every 6 (six) hours as needed for wheezing or shortness of breath.     [provider]  atorvastatin (LIPITOR) 80 MG tablet Take 80 mg by mouth daily. Patient not taking: Reported on 06/25/2020    [provider]  fexofenadine (ALLEGRA) 180 MG tablet Take 180 mg by mouth daily. Patient not taking: Reported on 06/25/2020    [provider]  fluticasone-salmeterol  (ADVAIR HFA) 115-21 MCG/ACT inhaler Inhale 1 puff into the lungs daily as needed (shortness of breath).     [provider]  loratadine (CLARITIN) 10 MG tablet Take 10 mg by mouth daily. Patient not taking: Reported on 06/25/2020    [provider]     All other systems have been reviewed and were otherwise negative with the exception of those mentioned in the HPI and as above.  Physical Exam: Vitals:   07/11/20 0636  BP: (!) 167/91  Pulse: 80  Resp: 18  Temp: 98.3 F (36.8 C)  SpO2: 95%    There is no height or weight on file to calculate BMI.  General: Alert, no acute distress Cardiovascular: No pedal edema Respiratory: No cyanosis, no use of accessory musculature Skin: No lesions in the area of chief complaint Neurologic: Sensation intact distally Psychiatric: Patient is competent for consent with normal mood and affect Lymphatic: No axillary or cervical lymphadenopathy   Assessment/Plan: BILATERAL LEG PAIN, SPINAL STENOSIS, L4/5 INSTABILITY Plan for Procedure(s): RIGHT-LUMBAR 4- LUMBAR 5 LATERAL INTERBODY FUSION WITH INSTRUMENTATION AND ALLOGRAFT   Norva Karvonen, MD 07/11/2020 8:04 AM

## 2020-07-11 NOTE — Anesthesia Preprocedure Evaluation (Signed)
Anesthesia Evaluation  Patient identified by MRN, date of birth, ID band  Reviewed: Allergy & Precautions, NPO status , Patient's Chart, lab work & pertinent test results  Airway Mallampati: III  TM Distance: >3 FB Neck ROM: Full    Dental  (+) Teeth Intact, Dental Advisory Given   Pulmonary    breath sounds clear to auscultation       Cardiovascular hypertension,  Rhythm:Regular Rate:Normal     Neuro/Psych    GI/Hepatic   Endo/Other    Renal/GU      Musculoskeletal   Abdominal (+) + obese,   Peds  Hematology   Anesthesia Other Findings   Reproductive/Obstetrics                             Anesthesia Physical Anesthesia Plan  ASA: III  Anesthesia Plan: General   Post-op Pain Management:    Induction: Intravenous  PONV Risk Score and Plan: Ondansetron and Dexamethasone  Airway Management Planned: Oral ETT  Additional Equipment:   Intra-op Plan:   Post-operative Plan: Extubation in OR  Informed Consent: I have reviewed the patients History and Physical, chart, labs and discussed the procedure including the risks, benefits and alternatives for the proposed anesthesia with the patient or authorized representative who has indicated his/her understanding and acceptance.     Dental advisory given  Plan Discussed with: CRNA and Anesthesiologist  Anesthesia Plan Comments:         Anesthesia Quick Evaluation

## 2020-07-11 NOTE — Anesthesia Postprocedure Evaluation (Signed)
Anesthesia Post Note  Patient: Shawn Barr  Procedure(s) Performed: RIGHT-LUMBAR 4- LUMBAR 5 LATERAL INTERBODY FUSION WITH INSTRUMENTATION AND ALLOGRAFT (Right Spine Lumbar)     Patient location during evaluation: PACU Anesthesia Type: General Level of consciousness: awake and alert Pain management: pain level controlled Vital Signs Assessment: post-procedure vital signs reviewed and stable Respiratory status: spontaneous breathing, nonlabored ventilation, respiratory function stable and patient connected to nasal cannula oxygen Cardiovascular status: blood pressure returned to baseline and stable Postop Assessment: no apparent nausea or vomiting Anesthetic complications: no   No complications documented.  Last Vitals:  Vitals:   07/11/20 1245 07/11/20 1327  BP: 117/76 114/75  Pulse: 69 67  Resp: 15 18  Temp: 36.8 C 36.7 C  SpO2: 94% 98%    Last Pain:  Vitals:   07/11/20 1327  TempSrc: Oral  PainSc:                  Shawn Barr

## 2020-07-11 NOTE — Transfer of Care (Signed)
Immediate Anesthesia Transfer of Care Note  Patient: Shawn Barr  Procedure(s) Performed: RIGHT-LUMBAR 4- LUMBAR 5 LATERAL INTERBODY FUSION WITH INSTRUMENTATION AND ALLOGRAFT (Right Spine Lumbar)  Patient Location: PACU  Anesthesia Type:General  Level of Consciousness: awake and patient cooperative  Airway & Oxygen Therapy: Patient Spontanous Breathing and Patient connected to face mask oxygen  Post-op Assessment: Report given to RN, Post -op Vital signs reviewed and stable and Patient moving all extremities X 4  Post vital signs: Reviewed and stable  Last Vitals:  Vitals Value Taken Time  BP 144/106 07/11/20 1157  Temp    Pulse 84 07/11/20 1200  Resp 16 07/11/20 1200  SpO2 94 % 07/11/20 1200  Vitals shown include unvalidated device data.  Last Pain:  Vitals:   07/11/20 0656  TempSrc:   PainSc: 0-No pain      Patients Stated Pain Goal: 3 (27/25/36 6440)  Complications: No complications documented.

## 2020-07-12 ENCOUNTER — Inpatient Hospital Stay (HOSPITAL_COMMUNITY)
Admission: RE | Disposition: A | Discharge status: Home / Self Care | Payer: Self-pay | Source: Home / Self Care | Attending: Orthopedic Surgery

## 2020-07-12 ENCOUNTER — Inpatient Hospital Stay (HOSPITAL_COMMUNITY)
Admission: RE | Admit: 2020-07-12 | Payer: Managed Care, Other (non HMO) | Source: Home / Self Care | Admitting: Orthopedic Surgery

## 2020-07-12 ENCOUNTER — Encounter (HOSPITAL_COMMUNITY): Payer: Self-pay | Admitting: Orthopedic Surgery

## 2020-07-12 ENCOUNTER — Inpatient Hospital Stay (HOSPITAL_COMMUNITY): Payer: Managed Care, Other (non HMO) | Admitting: Registered Nurse

## 2020-07-12 ENCOUNTER — Inpatient Hospital Stay (HOSPITAL_COMMUNITY): Payer: Managed Care, Other (non HMO)

## 2020-07-12 SURGERY — POSTERIOR LUMBAR FUSION 1 LEVEL
Anesthesia: General | Site: Spine Lumbar

## 2020-07-12 MED ORDER — MIDAZOLAM HCL 5 MG/5ML IJ SOLN
INTRAMUSCULAR | Status: DC | PRN
  Administered 2020-07-12: 2 mg via INTRAVENOUS

## 2020-07-12 MED ORDER — POVIDONE-IODINE 7.5 % EX SOLN: Freq: Once | CUTANEOUS | Status: DC

## 2020-07-12 MED ORDER — ONDANSETRON HCL 4 MG/2ML IJ SOLN
INTRAMUSCULAR | Status: DC | PRN
  Administered 2020-07-12: 4 mg via INTRAVENOUS

## 2020-07-12 MED ORDER — KETAMINE HCL 10 MG/ML IJ SOLN
INTRAMUSCULAR | Status: DC | PRN
  Administered 2020-07-12 (×2): 20 mg via INTRAVENOUS
  Administered 2020-07-12: 10 mg via INTRAVENOUS

## 2020-07-12 MED ORDER — BUPIVACAINE-EPINEPHRINE 0.25% -1:200000 IJ SOLN
INTRAMUSCULAR | Status: DC | PRN
  Administered 2020-07-12: 8 mL
  Administered 2020-07-12: 20 mL
  Administered 2020-07-12: 30 mL

## 2020-07-12 MED ORDER — LIDOCAINE 2% (20 MG/ML) 5 ML SYRINGE
INTRAMUSCULAR | Status: DC | PRN
  Administered 2020-07-12: 40 mg via INTRAVENOUS

## 2020-07-12 MED ORDER — 0.9 % SODIUM CHLORIDE (POUR BTL) OPTIME
TOPICAL | Status: DC | PRN
  Administered 2020-07-12: 1000 mL

## 2020-07-12 MED ORDER — KETAMINE HCL 50 MG/5ML IJ SOSY
PREFILLED_SYRINGE | INTRAMUSCULAR | Status: AC
  Filled 2020-07-12: qty 5

## 2020-07-12 MED ORDER — PROPOFOL 10 MG/ML IV BOLUS
INTRAVENOUS | Status: DC | PRN
  Administered 2020-07-12: 180 mg via INTRAVENOUS

## 2020-07-12 MED ORDER — DEXAMETHASONE SODIUM PHOSPHATE 10 MG/ML IJ SOLN
INTRAMUSCULAR | Status: DC | PRN
  Administered 2020-07-12: 10 mg via INTRAVENOUS

## 2020-07-12 MED ORDER — THROMBIN 20000 UNITS EX SOLR
CUTANEOUS | Status: DC | PRN
  Administered 2020-07-12 (×2): 20000 [IU] via TOPICAL

## 2020-07-12 MED ORDER — LACTATED RINGERS IV SOLN: INTRAVENOUS | Status: DC | PRN

## 2020-07-12 MED ORDER — BUPIVACAINE LIPOSOME 1.3 % IJ SUSP
INTRAMUSCULAR | Status: DC | PRN
  Administered 2020-07-12: 20 mL

## 2020-07-12 MED ORDER — EPHEDRINE SULFATE-NACL 50-0.9 MG/10ML-% IV SOSY
PREFILLED_SYRINGE | INTRAVENOUS | Status: DC | PRN
  Administered 2020-07-12: 10 mg via INTRAVENOUS
  Administered 2020-07-12 (×2): 5 mg via INTRAVENOUS

## 2020-07-12 MED ORDER — FENTANYL CITRATE (PF) 250 MCG/5ML IJ SOLN
INTRAMUSCULAR | Status: DC | PRN
Start: 2020-07-12 — End: 2020-07-12
  Administered 2020-07-12 (×2): 25 ug via INTRAVENOUS
  Administered 2020-07-12: 100 ug via INTRAVENOUS

## 2020-07-12 MED ORDER — BUPIVACAINE LIPOSOME 1.3 % IJ SUSP
20.0000 mL | Freq: Once | INTRAMUSCULAR | Status: DC
  Filled 2020-07-12: qty 20

## 2020-07-12 MED ORDER — ROCURONIUM BROMIDE 10 MG/ML (PF) SYRINGE
PREFILLED_SYRINGE | INTRAVENOUS | Status: DC | PRN
  Administered 2020-07-12: 10 mg via INTRAVENOUS
  Administered 2020-07-12: 30 mg via INTRAVENOUS
  Administered 2020-07-12: 60 mg via INTRAVENOUS

## 2020-07-12 MED ORDER — SUGAMMADEX SODIUM 200 MG/2ML IV SOLN
INTRAVENOUS | Status: DC | PRN
  Administered 2020-07-12 (×3): 100 mg via INTRAVENOUS

## 2020-07-12 SURGICAL SUPPLY — 81 items
30 ml 0.25% Bupivicaine ×3 IMPLANT
BENZOIN TINCTURE PRP APPL 2/3 (GAUZE/BANDAGES/DRESSINGS) ×3 IMPLANT
BLADE CLIPPER SURG (BLADE) ×3 IMPLANT
BUR ROUND FLUTED 5 RND (BURR) ×2 IMPLANT
BUR ROUND FLUTED 5MM RND (BURR) ×1
BUR SABER DIAMOND 5.0 (BURR) IMPLANT
CARTRIDGE OIL MAESTRO DRILL (MISCELLANEOUS) ×1 IMPLANT
CLOSURE WOUND 1/2 X4 (GAUZE/BANDAGES/DRESSINGS) ×2
CNTNR URN SCR LID CUP LEK RST (MISCELLANEOUS) ×1 IMPLANT
CONT SPEC 4OZ STRL OR WHT (MISCELLANEOUS) ×2
COVER MAYO STAND STRL (DRAPES) ×6 IMPLANT
COVER SURGICAL LIGHT HANDLE (MISCELLANEOUS) ×3 IMPLANT
COVER WAND RF STERILE (DRAPES) ×3 IMPLANT
DIFFUSER DRILL AIR PNEUMATIC (MISCELLANEOUS) ×3 IMPLANT
DRAPE C-ARM 42X72 X-RAY (DRAPES) ×3 IMPLANT
DRAPE C-ARMOR (DRAPES) ×3 IMPLANT
DRAPE POUCH INSTRU U-SHP 10X18 (DRAPES) ×3 IMPLANT
DRAPE SURG 17X23 STRL (DRAPES) ×12 IMPLANT
DURAPREP 26ML APPLICATOR (WOUND CARE) ×3 IMPLANT
ELECT BLADE 4.0 EZ CLEAN MEGAD (MISCELLANEOUS) ×3
ELECT CAUTERY BLADE 6.4 (BLADE) ×3 IMPLANT
ELECT REM PT RETURN 9FT ADLT (ELECTROSURGICAL) ×3
ELECTRODE BLDE 4.0 EZ CLN MEGD (MISCELLANEOUS) ×1 IMPLANT
ELECTRODE REM PT RTRN 9FT ADLT (ELECTROSURGICAL) ×1 IMPLANT
FILTER STRAW FLUID ASPIR (MISCELLANEOUS) ×3 IMPLANT
GAUZE 4X4 16PLY RFD (DISPOSABLE) ×3 IMPLANT
GAUZE SPONGE 4X4 12PLY STRL (GAUZE/BANDAGES/DRESSINGS) ×3 IMPLANT
GLOVE BIO SURGEON STRL SZ7 (GLOVE) ×3 IMPLANT
GLOVE BIO SURGEON STRL SZ8 (GLOVE) ×3 IMPLANT
GLOVE BIOGEL PI IND STRL 7.0 (GLOVE) ×1 IMPLANT
GLOVE BIOGEL PI IND STRL 8 (GLOVE) ×1 IMPLANT
GLOVE BIOGEL PI INDICATOR 7.0 (GLOVE) ×2
GLOVE BIOGEL PI INDICATOR 8 (GLOVE) ×2
GOWN STRL REUS W/ TWL LRG LVL3 (GOWN DISPOSABLE) ×2 IMPLANT
GOWN STRL REUS W/ TWL XL LVL3 (GOWN DISPOSABLE) ×1 IMPLANT
GOWN STRL REUS W/TWL LRG LVL3 (GOWN DISPOSABLE) ×6
GOWN STRL REUS W/TWL XL LVL3 (GOWN DISPOSABLE) ×3
GUIDEWIRE SHARP VIPER II (WIRE) ×12 IMPLANT
IV CATH 14GX2 1/4 (CATHETERS) ×3 IMPLANT
KIT BASIN OR (CUSTOM PROCEDURE TRAY) ×3 IMPLANT
KIT POSITION SURG JACKSON T1 (MISCELLANEOUS) ×3 IMPLANT
KIT TURNOVER KIT B (KITS) ×3 IMPLANT
MARKER SKIN DUAL TIP RULER LAB (MISCELLANEOUS) ×6 IMPLANT
NEEDLE 18GX1X1/2 (RX/OR ONLY) (NEEDLE) ×6 IMPLANT
NEEDLE 22X1 1/2 (OR ONLY) (NEEDLE) ×6 IMPLANT
NEEDLE HYPO 25GX1X1/2 BEV (NEEDLE) ×3 IMPLANT
NEEDLE SPNL 18GX3.5 QUINCKE PK (NEEDLE) ×6 IMPLANT
NS IRRIG 1000ML POUR BTL (IV SOLUTION) ×3 IMPLANT
OIL CARTRIDGE MAESTRO DRILL (MISCELLANEOUS) ×3
PACK LAMINECTOMY ORTHO (CUSTOM PROCEDURE TRAY) ×3 IMPLANT
PACK UNIVERSAL I (CUSTOM PROCEDURE TRAY) ×6 IMPLANT
PAD ARMBOARD 7.5X6 YLW CONV (MISCELLANEOUS) ×6 IMPLANT
PATTIES SURGICAL .5 X1 (DISPOSABLE) ×3 IMPLANT
PATTIES SURGICAL .5X1.5 (GAUZE/BANDAGES/DRESSINGS) ×3 IMPLANT
PUTTY DBX 1CC (Putty) ×3 IMPLANT
PUTTY DBX 1CC DEPUY (Putty) ×1 IMPLANT
ROD VIPER2 5.5X45 PRE LARDOSED (Rod) ×6 IMPLANT
SCREW SET SINGLE INNER MIS (Screw) ×12 IMPLANT
SCREW VIPER 7MMX30MM CORTICAL (Screw) ×3 IMPLANT
SCREW XTAB POLY VIPER  7X45 (Screw) ×6 IMPLANT
SCREW XTAB POLY VIPER 7X45 (Screw) ×3 IMPLANT
SPONGE INTESTINAL PEANUT (DISPOSABLE) ×3 IMPLANT
SPONGE SURGIFOAM ABS GEL 100 (HEMOSTASIS) ×6 IMPLANT
STRIP CLOSURE SKIN 1/2X4 (GAUZE/BANDAGES/DRESSINGS) ×4 IMPLANT
SURGIFLO W/THROMBIN 8M KIT (HEMOSTASIS) IMPLANT
SUT MNCRL AB 4-0 PS2 18 (SUTURE) ×6 IMPLANT
SUT VIC AB 0 CT1 18XCR BRD 8 (SUTURE) ×1 IMPLANT
SUT VIC AB 0 CT1 8-18 (SUTURE) ×2
SUT VIC AB 1 CT1 18XCR BRD 8 (SUTURE) ×1 IMPLANT
SUT VIC AB 1 CT1 8-18 (SUTURE) ×2
SUT VIC AB 2-0 CT2 18 VCP726D (SUTURE) ×6 IMPLANT
SYR 20ML LL LF (SYRINGE) ×6 IMPLANT
SYR BULB IRRIG 60ML STRL (SYRINGE) ×3 IMPLANT
SYR CONTROL 10ML LL (SYRINGE) ×6 IMPLANT
SYR TB 1ML LUER SLIP (SYRINGE) ×3 IMPLANT
TAP CANN VIPER2 DL 6.0 (TAP) ×3 IMPLANT
TAP CANN VIPER2 DL 7.0 (TAP) ×3 IMPLANT
TAPE STRIPS DRAPE STRL (GAUZE/BANDAGES/DRESSINGS) ×3 IMPLANT
TRAY FOLEY MTR SLVR 16FR STAT (SET/KITS/TRAYS/PACK) ×3 IMPLANT
WATER STERILE IRR 1000ML POUR (IV SOLUTION) ×3 IMPLANT
YANKAUER SUCT BULB TIP NO VENT (SUCTIONS) ×3 IMPLANT

## 2020-07-12 NOTE — Op Note (Signed)
PATIENT NAME: Shawn Barr   MEDICAL RECORD NO.:   [id number]    DATE OF BIRTH: [birth year]   DATE OF PROCEDURE: 07/12/2020                               OPERATIVE REPORT   PREOPERATIVE DIAGNOSES: 1.  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  Bilateral L4 pars defect 3.  Grade 1 L4/5 spondylolisthesis  4.  Status post L4-5 lateral interbody fusion with instrumentation  POSTOPERATIVE DIAGNOSES: 1.  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  Bilateral L4 pars defect 3.  Grade 1 L4/5 spondylolisthesis  4.  Status post L4-5 lateral interbody fusion with instrumentation   PROCEDURE (stage 2 of 2)  1.  Posterior spinal fusion, L3-4 2.  Placement of posterior instrumentation, L3, L4 3.  Use of morselized allograft: DBX-putty.   4.  Intraoperative use of fluoroscopy.   SURGEON:  Phylliss Bob, MD   ASSISTANT:  Pricilla Holm PA-C.   ANESTHESIA:  General endotracheal anesthesia.   COMPLICATIONS:  None.   DISPOSITION:  Stable.   ESTIMATED BLOOD LOSS:  Minimal.   INDICATIONS:  Briefly, the patient is a pleasant 49 year old male who is status post a lateral L4-5 fusion from 1 day prior.  He did present today for stage 2 of his procedure, a posterior spinal fusion with instrumentation.  Please refer to my operative report dated 07/11/2020 for additional details regarding the patient's surgery.  DESCRIPTION OF PROCEDURE:  On 07/12/2020 the patient was brought to surgery and general endotracheal anesthesia was administered.  The patient was placed prone on a flat Jackson bed with a spinal frame.  The back was prepped and draped in the usual sterile fashion, and a timeout procedure was performed.   At this point, I made a bilateral paramedian incisions overlying the L4 and L5 pedicles.  The left L4-5 facet joint was exposed and decorticated, as was the posteriolateral gutter. DBX putty was placed along the facet joint and gutter to help aid in the success  of the L4-5 fusion.  At this point, Jamshidi's were advanced across the L4 and L5 pedicles.  Guidewires were advanced, and I did use a 7 mm tap over the guidewires, and through the L4 and L5 pedicles.  At this point, 7 mm screws were advanced over the guidewires.  Excellent purchase of the screws were noted.  I then secured 45 mm rods into the tulip heads of the screws on the right and left sides.  Caps were placed over the rods and a final tightening procedure was performed over each screw. The wound was copiously irrigated.  I was very pleased with the AP and lateral fluoroscopic images.  The wound was then closed using #1 Vicryl followed by 2-0 Vicryl followed by 4-0 Monocryl.  Benzoin and Steri-Strips were applied over the left lateral wound and left posterior wound, followed by sterile dressing.     Of note, Pricilla Holm was my assistant throughout surgery, and did aid in retraction, placement of the hardware, suctioning, and closure.   Phylliss Bob, MD

## 2020-07-12 NOTE — H&P (Signed)
Patient tolerated stage one of his procedure yesterday well.  We will proceed today with stage 2, specifically, a posterior spinal fusion with instrumentation at L4-5.

## 2020-07-12 NOTE — Evaluation (Signed)
Physical Therapy Evaluation Patient Details Name: Shawn Barr MRN: [id number] DOB: June 06, 1971 Today's Date: 07/12/2020   History of Present Illness  Pt is 49 yo male with spinal stenosis at L4/5 with a pars defect and instability.  He had bil leg pain. Pt with 2 stage back surgery. Pt is now s/p R sided lateral interbody fusion of L4-L5 on 07/11/20 and Posterior Spinal fusion L3-4 on 07/12/20.  PMH including hypelipidemia, HTN, hypothyroidism, sleep apnea.  Clinical Impression  Pt admitted for above 2 stage back surgery.  He presents with good understanding of back precautions and back brace.  He demonstrated safe gait and transfers without AD and has good pain control.  Pt has good support system at home and does not need AD.  No further acute PT indicated.     Follow Up Recommendations Follow surgeon's recommendation for DC plan and follow-up therapies;Supervision - Intermittent    Equipment Recommendations  None recommended by PT    Recommendations for Other Services       Precautions / Restrictions Precautions Precautions: Fall;Back Precaution Booklet Issued: Yes (comment) Precaution Comments: Educated on back precautions and gave handout Required Braces or Orthoses: Spinal Brace Spinal Brace: Applied in sitting position;Thoracolumbosacral orthotic      Mobility  Bed Mobility Overal bed mobility: Modified Independent Bed Mobility: Rolling;Sidelying to Sit;Sit to Sidelying Rolling: Modified independent (Device/Increase time) Sidelying to sit: Modified independent (Device/Increase time)     Sit to sidelying: Modified independent (Device/Increase time) General bed mobility comments: educated on log roll but then demonstrated without difficulty  Transfers Overall transfer level: Needs assistance Equipment used: None Transfers: Sit to/from Stand Sit to Stand: Supervision            Ambulation/Gait Ambulation/Gait assistance: Supervision Gait Distance (Feet):  300 Feet Assistive device: None;Rolling walker (2 wheeled) Gait Pattern/deviations: Step-through pattern Gait velocity: normal   General Gait Details: reciprocal gait, normal pattern and speed, no LOB, performed 200' with RW and 100' without RW  Stairs            Wheelchair Mobility    Modified Rankin (Stroke Patients Only)       Balance Overall balance assessment: Needs assistance Sitting-balance support: No upper extremity supported Sitting balance-Leahy Scale: Good     Standing balance support: No upper extremity supported Standing balance-Leahy Scale: Good                               Pertinent Vitals/Pain Pain Assessment: 0-10 Pain Score: 2  Pain Location: back Pain Descriptors / Indicators: Sore Pain Intervention(s): Monitored during session;Other (comment) (reports significantly better than prior to sx)    Home Living Family/patient expects to be discharged to:: Private residence Living Arrangements: Spouse/significant other Available Help at Discharge: Family;Available 24 hours/day Type of Home: House Home Access: Level entry     Home Layout: One level Home Equipment: Cane - single point      Prior Function Level of Independence: Independent         Comments: Worked in Animal nutritionist        Extremity/Trunk Assessment   Upper Extremity Assessment Upper Extremity Assessment: Overall WFL for tasks assessed    Lower Extremity Assessment Lower Extremity Assessment: Overall WFL for tasks assessed (ROM WFL: MMT demonstrates at least 3/5 but not further tested due to acuity of sx)    Cervical / Trunk Assessment Cervical / Trunk Assessment: Other exceptions  Cervical / Trunk Exceptions: within back precautions  Communication   Communication: No difficulties  Cognition Arousal/Alertness: Awake/alert Behavior During Therapy: WFL for tasks assessed/performed Overall Cognitive Status: Within Functional Limits for  tasks assessed                                        General Comments General comments (skin integrity, edema, etc.): Educated on back precautions and back brace (when to wear and don/doff)    Exercises     Assessment/Plan    PT Assessment Patent does not need any further PT services  PT Problem List         PT Treatment Interventions      PT Goals (Current goals can be found in the Care Plan section)  Acute Rehab PT Goals Patient Stated Goal: return home PT Goal Formulation: All assessment and education complete, DC therapy Potential to Achieve Goals: Good    Frequency     Barriers to discharge        Co-evaluation               AM-PAC PT "6 Clicks" Mobility  Outcome Measure Help needed turning from your back to your side while in a flat bed without using bedrails?: None Help needed moving from lying on your back to sitting on the side of a flat bed without using bedrails?: None Help needed moving to and from a bed to a chair (including a wheelchair)?: None Help needed standing up from a chair using your arms (e.g., wheelchair or bedside chair)?: None Help needed to walk in hospital room?: None Help needed climbing 3-5 steps with a railing? : None 6 Click Score: 24    End of Session Equipment Utilized During Treatment: Back brace Activity Tolerance: Patient tolerated treatment well Patient left: in bed;with call bell/phone within reach (sitting EOB with brace on) Nurse Communication: Mobility status      Time: 1710-1733 PT Time Calculation (min) (ACUTE ONLY): 23 min   Charges:   PT Evaluation $PT Eval Low Complexity: 1 Low PT Treatments $Therapeutic Activity: 8-22 mins        Abran Richard, PT Acute Rehab Services Pager [phone] [facility] 07/12/2020, 5:49 PM

## 2020-07-12 NOTE — Transfer of Care (Signed)
Immediate Anesthesia Transfer of Care Note  Patient: Shawn Barr  Procedure(s) Performed: LUMBAR 4 - LUMBAR 5 POSTERIOR SPINAL FUSION WITH INSTRUMENTATION AND ALLOGRAFT (N/A Spine Lumbar)  Patient Location: PACU  Anesthesia Type:General  Level of Consciousness: awake, alert  and oriented  Airway & Oxygen Therapy: Patient Spontanous Breathing and Patient connected to face mask oxygen  Post-op Assessment: Report given to RN and Post -op Vital signs reviewed and stable  Post vital signs: Reviewed and stable  Last Vitals:  Vitals Value Taken Time  BP 102/52 07/12/20 1023  Temp    Pulse 92 07/12/20 1028  Resp 15 07/12/20 1028  SpO2 97 % 07/12/20 1028  Vitals shown include unvalidated device data.  Last Pain:  Vitals:   07/12/20 0458  TempSrc: Oral  PainSc:       Patients Stated Pain Goal: 3 (97/74/14 2395)  Complications: No complications documented.

## 2020-07-12 NOTE — Anesthesia Preprocedure Evaluation (Addendum)
Anesthesia Evaluation  Patient identified by MRN, date of birth, ID band Patient awake    Reviewed: Allergy & Precautions, NPO status , Patient's Chart, lab work & pertinent test results  Airway Mallampati: III  TM Distance: >3 FB Neck ROM: Full    Dental  (+) Teeth Intact, Dental Advisory Given   Pulmonary sleep apnea and Continuous Positive Airway Pressure Ventilation ,    breath sounds clear to auscultation       Cardiovascular hypertension, Pt. on medications  Rhythm:Regular Rate:Normal     Neuro/Psych  Neuromuscular disease negative psych ROS   GI/Hepatic negative GI ROS, Neg liver ROS,   Endo/Other  Hypothyroidism   Renal/GU negative Renal ROS     Musculoskeletal   Abdominal (+) + obese,   Peds  Hematology   Anesthesia Other Findings   Reproductive/Obstetrics                            Anesthesia Physical Anesthesia Plan  ASA: II  Anesthesia Plan: General   Post-op Pain Management:    Induction: Intravenous  PONV Risk Score and Plan: 3 and Ondansetron, Dexamethasone and Midazolam  Airway Management Planned: Oral ETT  Additional Equipment: None  Intra-op Plan:   Post-operative Plan: Extubation in OR  Informed Consent: I have reviewed the patients History and Physical, chart, labs and discussed the procedure including the risks, benefits and alternatives for the proposed anesthesia with the patient or authorized representative who has indicated his/her understanding and acceptance.     Dental advisory given  Plan Discussed with: CRNA  Anesthesia Plan Comments:        Anesthesia Quick Evaluation

## 2020-07-12 NOTE — Anesthesia Procedure Notes (Signed)
Procedure Name: Intubation Date/Time: 07/12/2020 7:50 AM Performed by: Trinna Post., CRNA Pre-anesthesia Checklist: Patient identified, Emergency Drugs available, Suction available, Patient being monitored and Timeout performed Patient Re-evaluated:Patient Re-evaluated prior to induction Oxygen Delivery Method: Circle system utilized Preoxygenation: Pre-oxygenation with 100% oxygen Induction Type: IV induction Ventilation: Oral airway inserted - appropriate to patient size and Two handed mask ventilation required Laryngoscope Size: Glidescope and 4 Grade View: Grade I Tube type: Oral Tube size: 7.5 mm Number of attempts: 1 Airway Equipment and Method: Rigid stylet and Video-laryngoscopy Placement Confirmation: ETT inserted through vocal cords under direct vision,  positive ETCO2 and breath sounds checked- equal and bilateral Secured at: 23 cm Tube secured with: Tape Dental Injury: Teeth and Oropharynx as per pre-operative assessment  Difficulty Due To: Difficulty was anticipated

## 2020-07-12 NOTE — Progress Notes (Signed)
Pt refused cpap for the nightt

## 2020-07-12 NOTE — Anesthesia Postprocedure Evaluation (Signed)
Anesthesia Post Note  Patient: Shawn Barr  Procedure(s) Performed: LUMBAR 4 - LUMBAR 5 POSTERIOR SPINAL FUSION WITH INSTRUMENTATION AND ALLOGRAFT (N/A Spine Lumbar)     Patient location during evaluation: PACU Anesthesia Type: General Level of consciousness: awake and alert Pain management: pain level controlled Vital Signs Assessment: post-procedure vital signs reviewed and stable Respiratory status: spontaneous breathing, nonlabored ventilation, respiratory function stable and patient connected to nasal cannula oxygen Cardiovascular status: blood pressure returned to baseline and stable Postop Assessment: no apparent nausea or vomiting Anesthetic complications: no   No complications documented.  Last Vitals:  Vitals:   07/12/20 1050 07/12/20 1122  BP: 111/73 111/62  Pulse: 92 91  Resp: 14 18  Temp: 36.7 C 36.8 C  SpO2: 96% 94%    Last Pain:  Vitals:   07/12/20 1122  TempSrc: Oral  PainSc:                  [name]

## 2020-07-13 MED ORDER — OXYCODONE-ACETAMINOPHEN 5-325 MG PO TABS: 1.0000 | ORAL_TABLET | ORAL | 0 refills | Status: DC | PRN

## 2020-07-13 MED ORDER — METHOCARBAMOL 500 MG PO TABS: 500.0000 mg | ORAL_TABLET | Freq: Four times a day (QID) | ORAL | 1 refills | Status: DC | PRN

## 2020-07-13 NOTE — Progress Notes (Signed)
    Patient doing well  Minimal back pain Denies leg pain   Physical Exam: Vitals:   07/12/20 2335 07/13/20 0358  BP: 131/72 138/81  Pulse: 80 72  Resp: 20 18  Temp: 98.8 F (37.1 C) 98.5 F (36.9 C)  SpO2: 96% 99%    Dressing in place NVI  POD s/p A/P fusion, doing very well with minimal pain   - up with PT/OT, encourage ambulation - Percocet for pain, Robaxin for muscle spasms - d/c home today with f/u in 2 weeks

## 2020-07-13 NOTE — Progress Notes (Signed)
Occupational Therapy Evaluation Patient Details Name: Shawn Barr MRN: [id number] DOB: 24-May-1971 Today's Date: 07/13/2020    History of Present Illness Pt is 49 yo male with spinal stenosis at L4/5 with a pars defect and instability.  He had bil leg pain. Pt with 2 stage back surgery. Pt is now s/p R sided lateral interbody fusion of L4-L5 on 07/11/20 and Posterior Spinal fusion L3-4 on 07/12/20.  PMH including hypelipidemia, HTN, hypothyroidism, sleep apnea.   Clinical Impression   Completed education regarding compensatory strategies and use of AE/DME to maximize independence with ADL and functional mobility for ADL while adhering to back precautions. Pt/wife able to return demonstrate while getting dressed. Pt modified independent with use of AE/DME. Will need a 3 in1 to increase independence with toilet transfers and bathing. Nsg notified. No further OT needed. Pt/wife very appreciative.     Follow Up Recommendations  No OT follow up;Supervision - Intermittent    Equipment Recommendations  3 in 1 bedside commode    Recommendations for Other Services       Precautions / Restrictions Precautions Precautions: Fall;Back Precaution Booklet Issued: Yes (comment) Required Braces or Orthoses: Spinal Brace Spinal Brace: Applied in sitting position;Thoracolumbosacral orthotic      Mobility Bed Mobility Overal bed mobility: Modified Independent                Transfers Overall transfer level: Modified independent    Initially using RW, however not needed                Balance Overall balance assessment: Mild deficits observed, not formally tested                                         ADL either performed or assessed with clinical judgement   ADL Overall ADL's : Needs assistance/impaired                         Toilet Transfer: Modified Independent;BSC   Toileting- Clothing Manipulation and Hygiene: Modified  independent Toileting - Clothing Manipulation Details (indicate cue type and reason): with use of toilet aid     Functional mobility during ADLs: Modified independent General ADL Comments: Educated on use of AE for LB ADL as pt unable to complete figure four positioning. Dressed using AE with VC. Educated on techniques for bathing and grooming. Unable to reach periarea - will need toilet aid. Educated pt/wife on availability of AE - hip kit and toilet aid; pt modified with use of AE. Able to use 3in1 as shower chair and over toilet to help with transfers. Pt return demonstrated use of 3in1. Wife present for education and verbalized understanding.     Vision         Perception     Praxis      Pertinent Vitals/Pain Pain Assessment: 0-10 Pain Score: 5  Pain Location: back Pain Descriptors / Indicators: Sore Pain Intervention(s): Limited activity within patient's tolerance     Hand Dominance Right   Extremity/Trunk Assessment Upper Extremity Assessment Upper Extremity Assessment: Overall WFL for tasks assessed   Lower Extremity Assessment Lower Extremity Assessment: Defer to PT evaluation   Cervical / Trunk Assessment Cervical / Trunk Assessment: Other exceptions (back surgery)   Communication Communication Communication: No difficulties   Cognition Arousal/Alertness: Awake/alert Behavior During Therapy: WFL for tasks assessed/performed Overall Cognitive Status: Within Functional  Limits for tasks assessed                                     General Comments       Exercises     Shoulder Instructions      Home Living Family/patient expects to be discharged to:: Private residence Living Arrangements: Spouse/significant other Available Help at Discharge: Family;Available PRN/intermittently (wife works part time) Type of Home: House Home Access: Level entry     [place]: One level     Bathroom Shower/Tub: Teacher, early years/pre:  Standard Bathroom Accessibility: No   Home Equipment: Ferguson - single point          Prior Functioning/Environment Level of Independence: Independent        Comments: Worked in Musician Problem List: Decreased range of motion;Decreased knowledge of use of DME or AE;Decreased knowledge of precautions;Obesity;Pain      OT Treatment/Interventions:      OT Goals(Current goals can be found in the care plan section) Acute Rehab OT Goals Patient Stated Goal: return home OT Goal Formulation: All assessment and education complete, DC therapy  OT Frequency:     Barriers to D/C:            Co-evaluation              AM-PAC OT "6 Clicks" Daily Activity     Outcome Measure Help from another person eating meals?: None Help from another person taking care of personal grooming?: A Little Help from another person toileting, which includes using toliet, bedpan, or urinal?: A Little Help from another person bathing (including washing, rinsing, drying)?: A Little Help from another person to put on and taking off regular upper body clothing?: A Little Help from another person to put on and taking off regular lower body clothing?: A Little 6 Click Score: 19   End of Session Equipment Utilized During Treatment: Back brace Nurse Communication: Mobility status;Other (comment) (need for 3in1)  Activity Tolerance: Patient tolerated treatment well Patient left: in bed;with call bell/phone within reach;with family/visitor present  OT Visit Diagnosis: Pain Pain - part of body:  (back)                Time: 3475-8307 OT Time Calculation (min): 40 min Charges:  OT General Charges $OT Visit: 1 Visit OT Evaluation $OT Eval Low Complexity: 1 Low OT Treatments $Self Care/Home Management : 23-37 mins  Maurie Boettcher, OT/L   Acute OT Clinical Specialist Acute Rehabilitation Services Pager [phone] Office [phone]   [facility] 07/13/2020, 10:02 AM

## 2020-07-13 NOTE — Progress Notes (Signed)
Patient alert and oriented, mae's well, voiding adequate amount of urine, swallowing without difficulty, no c/o pain at time of discharge. Patient discharged home with family. Script and discharged instructions given to patient. Patient and family stated understanding of instructions given. Patient has an appointment with Dr. Dumonski 

## 2020-07-16 ENCOUNTER — Encounter (HOSPITAL_COMMUNITY): Payer: Self-pay | Admitting: Orthopedic Surgery

## 2020-07-16 MED FILL — Thrombin (Recombinant) For Soln 20000 Unit: CUTANEOUS | Qty: 2 | Status: AC

## 2020-07-19 NOTE — Discharge Summary (Signed)
Patient ID: Shawn Barr MRN: [id number] DOB/AGE: 48/30/1972 48 y.o.  Admit date: 07/11/2020 Discharge date: 07/13/2020  Admission Diagnoses:  Active Problems:   Radiculopathy   Discharge Diagnoses:  Same  Past Medical History:  Diagnosis Date  . Allergic rhinitis   . Broken arm    Left  . Broken wrist   . Chronic LBP   . Daytime somnolence   . Elevated LFTs    Fatty liver  . Gynecomastia   . Hyperlipidemia   . Hypertension   . Hypertriglyceridemia   . Hypothyroidism   . OSA (obstructive sleep apnea)    CPAP  . Umbilical hernia     Surgeries: Procedure(s): LUMBAR 4 - LUMBAR 5 POSTERIOR SPINAL FUSION WITH INSTRUMENTATION AND ALLOGRAFT on 07/12/2020   Consultants: None  Discharged Condition: Improved  Hospital Course: Shawn Barr is an 49 y.o. male who was admitted 07/11/2020 for operative treatment of radiculopathy. Patient has severe unremitting pain that affects sleep, daily activities, and work/hobbies. After pre-op clearance the patient was taken to the operating room on 07/12/2020 and underwent  Procedure(s): LUMBAR 4 - LUMBAR 5 POSTERIOR SPINAL FUSION WITH INSTRUMENTATION AND ALLOGRAFT.    Patient was given perioperative antibiotics:  Anti-infectives (From admission, onward)   Start     Dose/Rate Route Frequency Ordered Stop   07/12/20 0600  ceFAZolin (ANCEF) IVPB 2g/100 mL premix        2 g 200 mL/hr over 30 Minutes Intravenous On call to O.R. 07/11/20 1932 07/12/20 0822   07/11/20 1430  ceFAZolin (ANCEF) IVPB 2g/100 mL premix        2 g 200 mL/hr over 30 Minutes Intravenous Every 8 hours 07/11/20 1334 07/11/20 2128   07/11/20 0645  ceFAZolin (ANCEF) IVPB 2g/100 mL premix  Status:  Discontinued        2 g 200 mL/hr over 30 Minutes Intravenous On call to O.R. 07/11/20 0630 07/11/20 1316       Patient was given sequential compression devices, early ambulation to prevent DVT.  Patient benefited maximally from hospital stay and there  were no complications.    Recent vital signs: BP (!) 151/80 (BP Location: Right Arm)   Pulse 71   Temp 98.4 F (36.9 C) (Oral)   Resp 19   SpO2 98%    Discharge Medications:   Allergies as of 07/13/2020      Reactions   Milk-related Compounds Diarrhea   Peanut-containing Drug Products       Medication List    TAKE these medications   albuterol 108 (90 Base) MCG/ACT inhaler Commonly known as: VENTOLIN HFA Inhale 1-2 puffs into the lungs every 6 (six) hours as needed for wheezing or shortness of breath.   amLODipine 5 MG tablet Commonly known as: NORVASC Take 5 mg by mouth daily.   atorvastatin 80 MG tablet Commonly known as: LIPITOR Take 80 mg by mouth daily.   cetirizine 10 MG tablet Commonly known as: ZYRTEC Take 10 mg by mouth daily.   fluticasone-salmeterol 115-21 MCG/ACT inhaler Commonly known as: ADVAIR HFA Inhale 1 puff into the lungs daily as needed (shortness of breath).   hydrALAZINE 25 MG tablet Commonly known as: APRESOLINE Take 25 mg by mouth at bedtime.   hydrochlorothiazide 12.5 MG capsule Commonly known as: MICROZIDE Take 12.5 mg by mouth daily.   methocarbamol 500 MG tablet Commonly known as: ROBAXIN Take 1 tablet (500 mg total) by mouth every 6 (six) hours as needed for muscle spasms.  oxyCODONE-acetaminophen 5-325 MG tablet Commonly known as: PERCOCET/ROXICET Take 1-2 tablets by mouth every 4 (four) hours as needed for severe pain.       Diagnostic Studies: DG Lumbar Spine 2-3 Views  Result Date: 07/12/2020 CLINICAL DATA:  L4 and L5 fusion EXAM: LUMBAR SPINE - 2-3 VIEW; DG C-ARM 1-60 MIN COMPARISON:  July 11, 2020 FINDINGS: Frontal and lateral views obtained. There is posterior screw and plate fixation at L4 and L5 with a disc spacer at L4-5. Lateral screw and plate fixation also noted at L4 and L5. No fracture or spondylolisthesis. Visualized disc spaces appear normal. IMPRESSION: Postoperative screw and plate fixation at L4 and L5  with support hardware intact. Disc spacer at L4-5. No fracture or spondylolisthesis. Electronically Signed   By: Lowella Grip III M.D.   On: 07/12/2020 10:10   DG Lumbar Spine 2-3 Views  Result Date: 07/11/2020 CLINICAL DATA:  Lateral discectomy and fusion at L3-4. Question retained foreign object. EXAM: LUMBAR SPINE - 2-3 VIEW; DG C-ARM 1-60 MIN COMPARISON:  Earlier same day. FINDINGS: Lateral discectomy and fusion formed at the second to last disc space. No unexpected foreign object. Surgical components appear well position. IMPRESSION: Lateral discectomy and fusion at the second to last disc space. No sign of unexpected retained foreign object. These results were called by telephone at the time of interpretation on 07/11/2020 at 11:29 am to [place] , who verbally acknowledged these results. She also confirms of the surgery was at the L4-5 level. Electronically Signed   By: Nelson Chimes M.D.   On: 07/11/2020 11:30   DG C-Arm 1-60 Min  Result Date: 07/12/2020 CLINICAL DATA:  L4 and L5 fusion EXAM: LUMBAR SPINE - 2-3 VIEW; DG C-ARM 1-60 MIN COMPARISON:  July 11, 2020 FINDINGS: Frontal and lateral views obtained. There is posterior screw and plate fixation at L4 and L5 with a disc spacer at L4-5. Lateral screw and plate fixation also noted at L4 and L5. No fracture or spondylolisthesis. Visualized disc spaces appear normal. IMPRESSION: Postoperative screw and plate fixation at L4 and L5 with support hardware intact. Disc spacer at L4-5. No fracture or spondylolisthesis. Electronically Signed   By: Lowella Grip III M.D.   On: 07/12/2020 10:10   DG C-Arm 1-60 Min  Result Date: 07/11/2020 CLINICAL DATA:  Lateral discectomy and fusion at L3-4. Question retained foreign object. EXAM: LUMBAR SPINE - 2-3 VIEW; DG C-ARM 1-60 MIN COMPARISON:  Earlier same day. FINDINGS: Lateral discectomy and fusion formed at the second to last disc space. No unexpected foreign object. Surgical components appear  well position. IMPRESSION: Lateral discectomy and fusion at the second to last disc space. No sign of unexpected retained foreign object. These results were called by telephone at the time of interpretation on 07/11/2020 at 11:29 am to [place] , who verbally acknowledged these results. She also confirms of the surgery was at the L4-5 level. Electronically Signed   By: Nelson Chimes M.D.   On: 07/11/2020 11:30    Disposition: Discharge disposition: 01-Home or Self Care        POD s/p A/P fusion, doing very well with minimal pain   - up with PT/OT, encourage ambulation - Percocet for pain, Robaxin for muscle spasms -Scripts for pain sent to pharmacy electronically  -D/C instructions sheet printed and in chart -D/C today  -F/U in office 2 weeks   Signed: Lennie Muckle Ardra Kuznicki 07/19/2020, 7:34 AM

## 2021-03-12 ENCOUNTER — Telehealth: Payer: Managed Care, Other (non HMO) | Admitting: Adult Health

## 2021-04-02 ENCOUNTER — Other Ambulatory Visit (HOSPITAL_COMMUNITY): Payer: Self-pay | Admitting: Internal Medicine

## 2021-04-02 DIAGNOSIS — E215 Disorder of parathyroid gland, unspecified: Secondary | ICD-10-CM

## 2021-05-13 ENCOUNTER — Telehealth: Payer: Self-pay | Admitting: *Deleted

## 2021-05-13 NOTE — Telephone Encounter (Signed)
I called pt.  He does not normally have to bring his machine for CPAP DL.  I relayed that I was not able to remotely access this, (goes up to 12-2020).  May be the cell tower 3G to 5G conversion and not able to access now.  He will bring by in around [id number]  05-14-21 (will bring machine) to DL then will have Mychart VV at 1515 later that.

## 2021-05-14 ENCOUNTER — Encounter: Payer: Self-pay | Admitting: *Deleted

## 2021-05-14 ENCOUNTER — Telehealth: Payer: Managed Care, Other (non HMO) | Admitting: Adult Health

## 2021-05-14 DIAGNOSIS — G4733 Obstructive sleep apnea (adult) (pediatric): Secondary | ICD-10-CM | POA: Diagnosis not present

## 2021-05-14 DIAGNOSIS — Z9989 Dependence on other enabling machines and devices: Secondary | ICD-10-CM | POA: Diagnosis not present

## 2021-05-14 NOTE — Progress Notes (Signed)
PATIENT: Shawn Barr DOB: 07-04-1971  REASON FOR VISIT: follow up HISTORY FROM: patient PRIMARY NEUROLOGIST:   Virtual Visit via Video Note  I connected with Shawn Barr on 05/14/21 at  3:15 PM EDT by a video enabled telemedicine application located remotely at [facility] and verified that I am speaking with the correct person using two identifiers who was located at their own home.   I discussed the limitations of evaluation and management by telemedicine and the availability of in person appointments. The patient expressed understanding and agreed to proceed.   PATIENT: Shawn Barr DOB: 1971-04-29  REASON FOR VISIT: follow up HISTORY FROM: patient  Primary Neurologist: Dr. Brett Fairy   HISTORY OF PRESENT ILLNESS: Today 05/14/21:  Mr. Snedeker is a 50 year old male with a history of obstructive sleep apnea on CPAP.  He returns today for follow-up.  He reports that his CPAP is working well for him.  He denies any new issues.  His download is below    HISTORY 02/29/20:   Mr. Salberg is a 50 year old male with a history of obstructive sleep apnea on CPAP.  His download indicates that he uses machine 29 out of 30 days for compliance of 97%.  He uses machine greater than 4 hours 25 days for compliance of 83%.  On average he uses his machine 5 hours and 38 minutes.  His residual AHI is 0.9 on 6 to 14 cm of water with EPR 3.  Leak in the 95th percentile is 0.1 L/min he reports that the CPAP works well for him.  He does not like going without it.  Does report that his insurance is no longer in network with [facility] he will need to switch to another DME company     REVIEW OF SYSTEMS: Out of a complete 14 system review of symptoms, the patient complains only of the following symptoms, and all other reviewed systems are negative.  ESS 5  ALLERGIES: Allergies  Allergen Reactions   Milk-Related Compounds Diarrhea   Peanut-Containing  Drug Products     HOME MEDICATIONS: Outpatient Medications Prior to Visit  Medication Sig Dispense Refill   albuterol (PROVENTIL HFA;VENTOLIN HFA) 108 (90 BASE) MCG/ACT inhaler Inhale 1-2 puffs into the lungs every 6 (six) hours as needed for wheezing or shortness of breath.      amLODipine (NORVASC) 5 MG tablet Take 5 mg by mouth daily.     atorvastatin (LIPITOR) 80 MG tablet Take 80 mg by mouth daily. (Patient not taking: Reported on 06/25/2020)     cetirizine (ZYRTEC) 10 MG tablet Take 10 mg by mouth daily.     fluticasone-salmeterol (ADVAIR HFA) 115-21 MCG/ACT inhaler Inhale 1 puff into the lungs daily as needed (shortness of breath).      hydrALAZINE (APRESOLINE) 25 MG tablet Take 25 mg by mouth at bedtime.     hydrochlorothiazide (MICROZIDE) 12.5 MG capsule Take 12.5 mg by mouth daily.     methocarbamol (ROBAXIN) 500 MG tablet Take 1 tablet (500 mg total) by mouth every 6 (six) hours as needed for muscle spasms. 30 tablet 1   oxyCODONE-acetaminophen (PERCOCET/ROXICET) 5-325 MG tablet Take 1-2 tablets by mouth every 4 (four) hours as needed for severe pain. 30 tablet 0   No facility-administered medications prior to visit.    PAST MEDICAL HISTORY: Past Medical History:  Diagnosis Date   Allergic rhinitis    Broken arm    Left   Broken wrist  Chronic LBP    Daytime somnolence    Elevated LFTs    Fatty liver   Gynecomastia    Hyperlipidemia    Hypertension    Hypertriglyceridemia    Hypothyroidism    OSA (obstructive sleep apnea)    CPAP   Umbilical hernia     PAST SURGICAL HISTORY: Past Surgical History:  Procedure Laterality Date   ANKLE SURGERY  1995   ANTERIOR LAT LUMBAR FUSION Right 07/11/2020   Procedure: RIGHT-LUMBAR 4- LUMBAR 5 LATERAL INTERBODY FUSION WITH INSTRUMENTATION AND ALLOGRAFT;  Surgeon: Phylliss Bob, MD;  Location: [facility];  Service: Orthopedics;  Laterality: Right;   arm surgery  2000's   HAND SURGERY  1992   TONSILLECTOMY  1991    FAMILY  HISTORY: Family History  Problem Relation Age of Onset   Hypertension Father    Diabetes Father    Sleep apnea Father    Glaucoma Mother    Diabetes Other    Diabetes Other     SOCIAL HISTORY: Social History   Socioeconomic History   Marital status: Married    Spouse name: [name]   Number of children: 1   Years of education: College   Highest education level: Not on file  Occupational History   Not on file  Tobacco Use   Smoking status: Never   Smokeless tobacco: Never  Vaping Use   Vaping Use: Never used  Substance and Sexual Activity   Alcohol use: Yes    Comment: occas.   Drug use: No   Sexual activity: Yes  Other Topics Concern   Not on file  Social History Narrative   Patient is married [name]) and lives at home with his wife and one child.   Patient is working full-time.   Patient has a college education.   Patient is right-handed.   Patient drinks 2-3 cups of tea daily.               Social Determinants of Health   Financial Resource Strain: Not on file  Food Insecurity: Not on file  Transportation Needs: Not on file  Physical Activity: Not on file  Stress: Not on file  Social Connections: Not on file  Intimate Partner Violence: Not on file      PHYSICAL EXAM Generalized: Well developed, in no acute distress   Neurological examination  Mentation: Alert oriented to time, place, history taking. Follows all commands speech and language fluent Cranial nerve II-XII:Extraocular movements were full. Facial symmetry noted. uvula tongue midline. Head turning and shoulder shrug  were normal and symmetric. Motor: Good strength throughout subjectively per patient Sensory: Sensory testing is intact to soft touch on all 4 extremities subjectively per patient Coordination: Cerebellar testing reveals good finger-nose-finger  Gait and station: Patient is able to stand from a seated position. gait is normal.  Reflexes: UTA  DIAGNOSTIC DATA (LABS, IMAGING,  TESTING) - I reviewed patient records, labs, notes, testing and imaging myself where available.  Lab Results  Component Value Date   WBC 5.0 07/04/2020   HGB 14.9 07/04/2020   HCT 46.4 07/04/2020   MCV 85.1 07/04/2020   PLT 276 07/04/2020      Component Value Date/Time   NA 138 07/11/2020 0700   K 3.3 (L) 07/11/2020 0700   CL 103 07/11/2020 0700   CO2 25 07/11/2020 0700   GLUCOSE 152 (H) 07/11/2020 0700   BUN 5 (L) 07/11/2020 0700   CREATININE 0.59 (L) 07/11/2020 0700   CALCIUM 10.4 (H) 07/11/2020  0700   PROT 6.6 07/11/2020 0700   ALBUMIN 3.8 07/11/2020 0700   AST 47 (H) 07/11/2020 0700   ALT 52 (H) 07/11/2020 0700   ALKPHOS 61 07/11/2020 0700   BILITOT 0.8 07/11/2020 0700   GFRNONAA >60 07/11/2020 0700   No results found for: CHOL, HDL, LDLCALC, LDLDIRECT, TRIG, CHOLHDL No results found for: HGBA1C No results found for: VITAMINB12 No results found for: TSH    ASSESSMENT AND PLAN 49 y.o. year old male  has a past medical history of Allergic rhinitis, Broken arm, Broken wrist, Chronic LBP, Daytime somnolence, Elevated LFTs, Gynecomastia, Hyperlipidemia, Hypertension, Hypertriglyceridemia, Hypothyroidism, OSA (obstructive sleep apnea), and Umbilical hernia. here with:  OSA on CPAP  CPAP compliance excellent Residual AHI is good Encouraged patient to continue using CPAP nightly and > 4 hours each night F/U in 1 year or sooner if needed   Ward Givens, MSN, NP-C 05/14/2021, 2:55 PM [facility] [address], [place], [place] [address] [phone]

## 2021-10-02 ENCOUNTER — Other Ambulatory Visit (HOSPITAL_COMMUNITY): Payer: Self-pay | Admitting: Internal Medicine

## 2021-10-02 DIAGNOSIS — E215 Disorder of parathyroid gland, unspecified: Secondary | ICD-10-CM

## 2021-10-21 ENCOUNTER — Ambulatory Visit (HOSPITAL_COMMUNITY)
Admission: RE | Admit: 2021-10-21 | Discharge: 2021-10-21 | Disposition: A | Discharge status: Home / Self Care | Payer: Managed Care, Other (non HMO) | Source: Ambulatory Visit | Attending: Internal Medicine | Admitting: Internal Medicine

## 2021-10-21 ENCOUNTER — Other Ambulatory Visit: Payer: Self-pay

## 2021-10-21 ENCOUNTER — Encounter (HOSPITAL_COMMUNITY)
Admission: RE | Admit: 2021-10-21 | Discharge: 2021-10-21 | Disposition: A | Discharge status: Home / Self Care | Payer: Managed Care, Other (non HMO) | Source: Ambulatory Visit | Attending: Internal Medicine | Admitting: Internal Medicine

## 2021-10-21 DIAGNOSIS — E215 Disorder of parathyroid gland, unspecified: Secondary | ICD-10-CM | POA: Insufficient documentation

## 2021-10-21 MED ORDER — TECHNETIUM TC 99M SESTAMIBI GENERIC - CARDIOLITE
26.0000 | Freq: Once | INTRAVENOUS | Status: AC | PRN
  Administered 2021-10-21: 26 via INTRAVENOUS

## 2021-11-26 ENCOUNTER — Ambulatory Visit: Payer: Self-pay | Admitting: Surgery

## 2021-11-28 ENCOUNTER — Other Ambulatory Visit: Payer: Self-pay | Admitting: Surgery

## 2021-11-29 ENCOUNTER — Other Ambulatory Visit: Payer: Self-pay | Admitting: Surgery

## 2021-11-29 DIAGNOSIS — E210 Primary hyperparathyroidism: Secondary | ICD-10-CM

## 2021-12-03 ENCOUNTER — Ambulatory Visit
Admission: RE | Admit: 2021-12-03 | Discharge: 2021-12-03 | Disposition: A | Discharge status: Home / Self Care | Payer: Managed Care, Other (non HMO) | Source: Ambulatory Visit | Attending: Surgery | Admitting: Surgery

## 2021-12-03 ENCOUNTER — Other Ambulatory Visit: Payer: Self-pay

## 2021-12-03 DIAGNOSIS — E210 Primary hyperparathyroidism: Secondary | ICD-10-CM

## 2021-12-04 NOTE — Progress Notes (Signed)
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  Will proceed with scheduling of outpatient surgery as discussed in the office. ? ?tmg ? ?Armandina Gemma, MD ?[facility] ?A [facility] practice ?Office: [phone] ?

## 2021-12-19 NOTE — Progress Notes (Addendum)
COVID Vaccine Completed: yes x2 ?Date COVID Vaccine completed: ?Has received booster: ?COVID vaccine manufacturer: [employer]  ? ?Date of COVID positive in last 90 days: no ? ?PCP - Shon Baton, MD ?Cardiologist - n/a ? ?Chest x-ray - n/a ?EKG -  12/20/21 Epic/chart ?Stress Test - n/a ?ECHO - 2014 ?Cardiac Cath - n/a ?Pacemaker/ICD device last checked: n/a ?Spinal Cord Stimulator: n/a ? ?Bowel Prep - no ? ?Sleep Study - yes, positive ?CPAP - yes every night ? ?Fasting Blood Sugar - n/a ?Checks Blood Sugar _____ times a day ? ?Blood Thinner Instructions: n/a ?Aspirin Instructions: ?Last Dose: ? ?Activity level: Can go up a flight of stairs and perform activities of daily living without stopping and without symptoms of chest pain or shortness of breath.   ? ?Anesthesia review:  ? ?Patient denies shortness of breath, fever, cough and chest pain at PAT appointment ? ? ?Patient verbalized understanding of instructions that were given to them at the PAT appointment. Patient was also instructed that they will need to review over the PAT instructions again at home before surgery.  ?

## 2021-12-19 NOTE — Patient Instructions (Addendum)
DUE TO COVID-19 ONLY ONE VISITOR  (aged 50 and older)  IS ALLOWED TO COME WITH YOU AND STAY IN THE WAITING ROOM ONLY DURING PRE OP AND PROCEDURE.   ?**NO VISITORS ARE ALLOWED IN THE SHORT STAY AREA OR RECOVERY ROOM!!** ? ? Your procedure is scheduled on: 01/02/22 ? ? Report to [facility] Main Entrance ? ?  Report to admitting at 6:15 AM ? ? Call this number if you have problems the morning of surgery [phone] ? ? Do not eat food :After Midnight. ? ? After Midnight you may have the following liquids until 5:30 AM DAY OF SURGERY ? ?Water ?Black Coffee (sugar ok, NO MILK/CREAM OR CREAMERS)  ?Tea (sugar ok, NO MILK/CREAM OR CREAMERS) regular and decaf                             ?Plain Jell-O (NO RED)                                           ?Fruit ices (not with fruit pulp, NO RED)                                     ?Popsicles (NO RED)                                                                  ?Juice: apple, WHITE grape, WHITE cranberry ?Sports drinks like Gatorade (NO RED) ?Clear broth(vegetable,chicken,beef) ? ?FOLLOW BOWEL PREP AND ANY ADDITIONAL PRE OP INSTRUCTIONS YOU RECEIVED FROM YOUR SURGEON'S OFFICE!!! ?  ?  ?Oral Hygiene is also important to reduce your risk of infection.                                    ?Remember - BRUSH YOUR TEETH THE MORNING OF SURGERY WITH YOUR REGULAR TOOTHPASTE ? ? Take these medicines the morning of surgery with A SIP OF WATER: Inhaler, Amlodipine, Atorvastatin, Allegra ? ?Bring CPAP mask and tubing day of surgery. ?                  ?           You may not have any metal on your body including jewelry, and body piercing ? ?           Do not wear lotions, powders, cologne, or deodorant  ? ?            Men may shave face and neck. ? ? Do not bring valuables to the hospital. [facility] NOT ?            RESPONSIBLE   FOR VALUABLES. ?  ? Patients discharged on the day of surgery will not be allowed to drive home.  Someone NEEDS to stay with you for the first 24  hours after anesthesia. ? ?            Please read over the following fact sheets  you were given: IF YOU HAVE QUESTIONS ABOUT YOUR PRE-OP INSTRUCTIONS PLEASE CALL [phone]- Apolonio Schneiders ? ?   [facility] - Preparing for Surgery ?Before surgery, you can play an important role.  Because skin is not sterile, your skin needs to be as free of germs as possible.  You can reduce the number of germs on your skin by washing with CHG (chlorahexidine gluconate) soap before surgery.  CHG is an antiseptic cleaner which kills germs and bonds with the skin to continue killing germs even after washing. ?Please DO NOT use if you have an allergy to CHG or antibacterial soaps.  If your skin becomes reddened/irritated stop using the CHG and inform your nurse when you arrive at Short Stay. ?Do not shave (including legs and underarms) for at least 48 hours prior to the first CHG shower.  You may shave your face/neck. ? ?Please follow these instructions carefully: ? 1.  Shower with CHG Soap the night before surgery and the  morning of surgery. ? 2.  If you choose to wash your hair, wash your hair first as usual with your normal  shampoo. ? 3.  After you shampoo, rinse your hair and body thoroughly to remove the shampoo.                            ? 4.  Use CHG as you would any other liquid soap.  You can apply chg directly to the skin and wash.  Gently with a scrungie or clean washcloth. ? 5.  Apply the CHG Soap to your body ONLY FROM THE NECK DOWN.   Do   not use on face/ open      ?                     Wound or open sores. Avoid contact with eyes, ears mouth and   genitals (private parts).  ?                     Production manager,  Genitals (private parts) with your normal soap. ?            6.  Wash thoroughly, paying special attention to the area where your    surgery  will be performed. ? 7.  Thoroughly rinse your body with warm water from the neck down. ? 8.  DO NOT shower/wash with your normal soap after using and rinsing off the CHG Soap. ?                9.  Pat yourself dry with a clean towel. ?           10.  Wear clean pajamas. ?           11.  Place clean sheets on your bed the night of your first shower and do not  sleep with pets. ?Day of Surgery : ?Do not apply any lotions/deodorants the morning of surgery.  Please wear clean clothes to the hospital/surgery center. ? ?FAILURE TO FOLLOW THESE INSTRUCTIONS MAY RESULT IN THE CANCELLATION OF YOUR SURGERY ? ?PATIENT SIGNATURE_________________________________ ? ?NURSE SIGNATURE__________________________________ ? ?________________________________________________________________________  ?

## 2021-12-20 ENCOUNTER — Other Ambulatory Visit: Payer: Self-pay

## 2021-12-20 ENCOUNTER — Encounter (HOSPITAL_COMMUNITY)
Admission: RE | Admit: 2021-12-20 | Discharge: 2021-12-20 | Disposition: A | Discharge status: Home / Self Care | Payer: Managed Care, Other (non HMO) | Source: Ambulatory Visit | Attending: Surgery | Admitting: Surgery

## 2021-12-20 ENCOUNTER — Encounter (HOSPITAL_COMMUNITY): Payer: Self-pay

## 2021-12-20 VITALS — BP 152/89 | HR 71 | Temp 98.1°F | Resp 18 | Ht 69.0 in | Wt 236.6 lb

## 2021-12-20 DIAGNOSIS — Z01818 Encounter for other preprocedural examination: Secondary | ICD-10-CM | POA: Diagnosis present

## 2021-12-20 DIAGNOSIS — I2510 Atherosclerotic heart disease of native coronary artery without angina pectoris: Secondary | ICD-10-CM | POA: Diagnosis not present

## 2021-12-20 HISTORY — DX: Unspecified asthma, uncomplicated: J45.909

## 2021-12-20 HISTORY — DX: Cardiac murmur, unspecified: R01.1

## 2021-12-20 LAB — CBC
HCT: 41 % (ref 39.0–52.0)
Hemoglobin: 13.2 g/dL (ref 13.0–17.0)
MCH: 27.8 pg (ref 26.0–34.0)
MCHC: 32.2 g/dL (ref 30.0–36.0)
MCV: 86.5 fL (ref 80.0–100.0)
Platelets: 277 10*3/uL (ref 150–400)
RBC: 4.74 MIL/uL (ref 4.22–5.81)
RDW: 13.2 % (ref 11.5–15.5)
WBC: 5.4 10*3/uL (ref 4.0–10.5)
nRBC: 0 % (ref 0.0–0.2)

## 2021-12-20 LAB — BASIC METABOLIC PANEL
Anion gap: 7 (ref 5–15)
BUN: 11 mg/dL (ref 6–20)
CO2: 23 mmol/L (ref 22–32)
Calcium: 10.8 mg/dL — ABNORMAL HIGH (ref 8.9–10.3)
Chloride: 107 mmol/L (ref 98–111)
Creatinine, Ser: 0.55 mg/dL — ABNORMAL LOW (ref 0.61–1.24)
GFR, Estimated: >60 mL/min (ref >60)
Glucose, Bld: 97 mg/dL (ref 70–99)
Potassium: 3.5 mmol/L (ref 3.5–5.1)
Sodium: 137 mmol/L (ref 135–145)

## 2021-12-29 ENCOUNTER — Encounter (HOSPITAL_COMMUNITY): Payer: Self-pay | Admitting: Surgery

## 2021-12-29 DIAGNOSIS — E210 Primary hyperparathyroidism: Secondary | ICD-10-CM | POA: Diagnosis present

## 2021-12-29 NOTE — H&P (Signed)
? ? ? ?REFERRING PHYSICIAN: Olivia Canter, MD ? ?PROVIDER: Justis Closser Charlotta Newton, MD ? ?Chief Complaint: New Consultation (Primary hyperparathyroidism) ? ? ?History of Present Illness: ? ?Patient is referred by Dr. Shon Baton for surgical evaluation and management of newly diagnosed primary hyperparathyroidism. Patient was noted on routine laboratory studies to have an elevated serum calcium level. His 2 most recent levels were 11.2 and 11.3. Intact PTH level was elevated at 103. Patient was sent for nuclear medicine parathyroid scan performed on October 21, 2021. This localized a right inferior parathyroid adenoma. Patient has no prior history of endocrine disease. He has had no prior head or neck surgery. There is no family history of parathyroid disease or other endocrine neoplasms. Patient denies any symptoms. He denies fatigue. He denies bone and joint pain. He has no history of nephrolithiasis. Patient works for [employer]. ? ?Review of Systems: ?A complete review of systems was obtained from the patient. I have reviewed this information and discussed as appropriate with the patient. See HPI as well for other ROS. ? ?Review of Systems  ?Constitutional: Negative.  ?HENT: Negative.  ?Eyes: Negative.  ?Respiratory: Negative.  ?Cardiovascular: Negative.  ?Gastrointestinal: Negative.  ?Genitourinary: Negative.  ?Musculoskeletal: Negative.  ?Skin: Negative.  ?Neurological: Negative.  ?Endo/Heme/Allergies: Negative.  ?Psychiatric/Behavioral: Negative.  ? ? ?Medical History: ?Past Medical History:  ?Diagnosis Date  ? Asthma, unspecified asthma severity, unspecified whether complicated, unspecified whether persistent  ? Hypertension  ? Sleep apnea  ? ?Patient Active Problem List  ?Diagnosis  ? Primary hyperparathyroidism (CMS-HCC)  ? ?History reviewed. No pertinent surgical history.  ? ?No Known Allergies ? ?Current Outpatient Medications on File Prior to Visit  ?Medication Sig Dispense Refill   ? amLODIPine (NORVASC) 5 MG tablet Take 1 tablet by mouth once daily  ? atorvastatin (LIPITOR) 80 MG tablet Take 1 tablet by mouth once daily  ? hydrALAZINE (APRESOLINE) 25 MG tablet Take 1 tablet by mouth 2 (two) times daily  ? spironolactone (ALDACTONE) 25 MG tablet 1 tablet  ? ?No current facility-administered medications on file prior to visit.  ? ?Family History  ?Problem Relation Age of Onset  ? Diabetes Father  ? High blood pressure (Hypertension) Father  ? Obesity Father  ? High blood pressure (Hypertension) Brother  ? ? ?Social History  ? ?Tobacco Use  ?Smoking Status Never  ?Smokeless Tobacco Never  ? ? ?Social History  ? ?Socioeconomic History  ? Marital status: Unknown  ?Tobacco Use  ? Smoking status: Never  ? Smokeless tobacco: Never  ?Substance and Sexual Activity  ? Alcohol use: Yes  ? Drug use: Never  ? ?Objective:  ? ?Vitals:  ?BP: 134/78  ?Pulse: 86  ?Temp: 36.8 ?C (98.2 ?F)  ?SpO2: 98%  ?Weight: (!) 109.2 kg (240 lb 12.8 oz)  ?Height: 175.3 cm ('5\' 9"'$ )  ? ?Body mass index is 35.56 kg/m?. ? ?Physical Exam  ? ?GENERAL APPEARANCE ?Development: normal ?Nutritional status: normal ?Gross deformities: none ? ?SKIN ?Rash, lesions, ulcers: none ?Induration, erythema: none ?Nodules: none palpable ? ?EYES ?Conjunctiva and lids: normal ?Pupils: equal and reactive ?Iris: normal bilaterally ? ?EARS, NOSE, MOUTH, THROAT ?External ears: no lesion or deformity ?External nose: no lesion or deformity ?Hearing: grossly normal ?Due to Covid-19 pandemic, patient is wearing a mask. ? ?NECK ?Symmetric: yes ?Trachea: midline ?Thyroid: no palpable nodules in the thyroid bed ? ?CHEST ?Respiratory effort: normal ?Retraction or accessory muscle use: no ?Breath sounds: normal bilaterally ?Rales, rhonchi, wheeze: none ? ?CARDIOVASCULAR ?Auscultation: regular rhythm,  normal rate ?Murmurs: none ?Pulses: radial pulse 2+ palpable ?Lower extremity edema: none ? ?MUSCULOSKELETAL ?Station and gait: normal ?Digits and nails: no  clubbing or cyanosis ?Muscle strength: grossly normal all extremities ?Range of motion: grossly normal all extremities ?Deformity: none ? ?LYMPHATIC ?Cervical: none palpable ?Supraclavicular: none palpable ? ?PSYCHIATRIC ?Oriented to person, place, and time: yes ?Mood and affect: normal for situation ?Judgment and insight: appropriate for situation ? ?Assessment and Plan:  ? ?Primary hyperparathyroidism (CMS-HCC) ? ? ?Patient is referred by his primary care physician for surgical evaluation and management of newly diagnosed primary hyperparathyroidism. ? ?Patient provided with a copy of "Parathyroid Surgery: Treatment for Your Parathyroid Gland Problem", published by [employer], 12 pages. Book reviewed and explained to patient during visit today. ? ?Has biochemical evidence of primary hyperparathyroidism. Nuclear medicine parathyroid scan localizes a right inferior parathyroid adenoma. I would like to obtain an ultrasound examination of the neck, hopefully to confirm the location of the parathyroid adenoma and to rule out any evidence of concurrent thyroid disease. We will arrange for the study in the near future. ? ?Today we discussed minimally invasive parathyroidectomy as the procedure of choice. We discussed alternative treatments with medication which are not very satisfactory. We discussed performing surgery as an outpatient surgical procedure. We discussed the size and location of the incision. We discussed the postoperative recovery and return to work and activities. The patient understands and wishes to proceed in the near future. ? ?The risks and benefits of the procedure have been discussed at length with the patient. The patient understands the proposed procedure, potential alternative treatments, and the course of recovery to be expected. All of the patient's questions have been answered at this time. The patient wishes to proceed with surgery.  ? ?Armandina Gemma, MD ?[facility] ?A [facility]  practice ?Office: [phone] ? ?

## 2022-01-01 NOTE — Anesthesia Preprocedure Evaluation (Addendum)
Anesthesia Evaluation  ?Patient identified by MRN, date of birth, ID band ?Patient awake ? ? ? ?Reviewed: ?Allergy & Precautions, NPO status , Patient's Chart, lab work & pertinent test results ? ?Airway ?Mallampati: III ? ?TM Distance: >3 FB ?Neck ROM: Full ? ? ? Dental ? ?(+) Chipped, Missing,  ?  ?Pulmonary ?asthma , sleep apnea and Continuous Positive Airway Pressure Ventilation ,  ?  ?Pulmonary exam normal ? ? ? ? ? ? ? Cardiovascular ?hypertension, Pt. on medications ?Normal cardiovascular exam ? ?ECG: NSR, rate 70 ?  ?Neuro/Psych ? Neuromuscular disease negative psych ROS  ? GI/Hepatic ?negative GI ROS, Neg liver ROS,   ?Endo/Other  ?Hypothyroidism  ? Renal/GU ?negative Renal ROS  ? ?  ?Musculoskeletal ?negative musculoskeletal ROS ?(+)  ? Abdominal ?(+) + obese,   ?Peds ? Hematology ?negative hematology ROS ?(+)   ?Anesthesia Other Findings ?PRIMARY HYPERPARATHYROIDISM ? Reproductive/Obstetrics ? ?  ? ? ? ? ? ? ? ? ? ? ? ? ? ?  ?  ? ? ? ? ? ? ? ?Anesthesia Physical ?Anesthesia Plan ? ?ASA: 2 ? ?Anesthesia Plan: General  ? ?Post-op Pain Management:   ? ?Induction: Intravenous ? ?PONV Risk Score and Plan: 3 and Ondansetron, Midazolam, Dexamethasone and Treatment may vary due to age or medical condition ? ?Airway Management Planned: Oral ETT ? ?Additional Equipment:  ? ?Intra-op Plan:  ? ?Post-operative Plan: Extubation in OR ? ?Informed Consent: I have reviewed the patients History and Physical, chart, labs and discussed the procedure including the risks, benefits and alternatives for the proposed anesthesia with the patient or authorized representative who has indicated his/her understanding and acceptance.  ? ? ? ?Dental advisory given ? ?Plan Discussed with: CRNA ? ?Anesthesia Plan Comments:   ? ? ? ? ? ?Anesthesia Quick Evaluation ? ?

## 2022-01-02 ENCOUNTER — Ambulatory Visit (HOSPITAL_COMMUNITY)
Admission: RE | Admit: 2022-01-02 | Discharge: 2022-01-02 | Disposition: A | Discharge status: Home / Self Care | Payer: Managed Care, Other (non HMO) | Source: Ambulatory Visit | Attending: Surgery | Admitting: Surgery

## 2022-01-02 ENCOUNTER — Encounter (HOSPITAL_COMMUNITY)
Admission: RE | Disposition: A | Discharge status: Home / Self Care | Payer: Self-pay | Source: Ambulatory Visit | Attending: Surgery

## 2022-01-02 ENCOUNTER — Ambulatory Visit (HOSPITAL_BASED_OUTPATIENT_CLINIC_OR_DEPARTMENT_OTHER): Payer: Managed Care, Other (non HMO) | Admitting: Certified Registered Nurse Anesthetist

## 2022-01-02 ENCOUNTER — Other Ambulatory Visit: Payer: Self-pay

## 2022-01-02 ENCOUNTER — Encounter (HOSPITAL_COMMUNITY): Payer: Self-pay | Admitting: Surgery

## 2022-01-02 ENCOUNTER — Ambulatory Visit (HOSPITAL_COMMUNITY): Payer: Managed Care, Other (non HMO) | Admitting: Certified Registered Nurse Anesthetist

## 2022-01-02 DIAGNOSIS — J45909 Unspecified asthma, uncomplicated: Secondary | ICD-10-CM | POA: Diagnosis not present

## 2022-01-02 DIAGNOSIS — G4730 Sleep apnea, unspecified: Secondary | ICD-10-CM | POA: Insufficient documentation

## 2022-01-02 DIAGNOSIS — Z79899 Other long term (current) drug therapy: Secondary | ICD-10-CM | POA: Insufficient documentation

## 2022-01-02 DIAGNOSIS — E210 Primary hyperparathyroidism: Secondary | ICD-10-CM | POA: Diagnosis present

## 2022-01-02 DIAGNOSIS — E039 Hypothyroidism, unspecified: Secondary | ICD-10-CM | POA: Insufficient documentation

## 2022-01-02 DIAGNOSIS — I10 Essential (primary) hypertension: Secondary | ICD-10-CM | POA: Diagnosis not present

## 2022-01-02 DIAGNOSIS — D351 Benign neoplasm of parathyroid gland: Secondary | ICD-10-CM | POA: Diagnosis not present

## 2022-01-02 HISTORY — PX: PARATHYROIDECTOMY: SHX19

## 2022-01-02 SURGERY — PARATHYROIDECTOMY
Anesthesia: General | Site: Neck | Laterality: Right

## 2022-01-02 MED ORDER — OXYCODONE HCL 5 MG PO TABS: 5.0000 mg | ORAL_TABLET | Freq: Once | ORAL | Status: DC | PRN

## 2022-01-02 MED ORDER — CEFAZOLIN SODIUM-DEXTROSE 2-4 GM/100ML-% IV SOLN
2.0000 g | INTRAVENOUS | Status: AC
  Administered 2022-01-02: 2 g via INTRAVENOUS
  Filled 2022-01-02: qty 100

## 2022-01-02 MED ORDER — ESMOLOL HCL 100 MG/10ML IV SOLN
INTRAVENOUS | Status: AC
  Filled 2022-01-02: qty 10

## 2022-01-02 MED ORDER — 0.9 % SODIUM CHLORIDE (POUR BTL) OPTIME
TOPICAL | Status: DC | PRN
Start: 2022-01-02 — End: 2022-01-02
  Administered 2022-01-02: 1000 mL

## 2022-01-02 MED ORDER — LACTATED RINGERS IV SOLN: INTRAVENOUS | Status: DC

## 2022-01-02 MED ORDER — ACETAMINOPHEN 500 MG PO TABS
1000.0000 mg | ORAL_TABLET | Freq: Once | ORAL | Status: AC
  Administered 2022-01-02: 1000 mg via ORAL
  Filled 2022-01-02: qty 2

## 2022-01-02 MED ORDER — TRAMADOL HCL 50 MG PO TABS: 50.0000 mg | ORAL_TABLET | Freq: Four times a day (QID) | ORAL | 0 refills | Status: DC | PRN

## 2022-01-02 MED ORDER — PHENYLEPHRINE 40 MCG/ML (10ML) SYRINGE FOR IV PUSH (FOR BLOOD PRESSURE SUPPORT)
PREFILLED_SYRINGE | INTRAVENOUS | Status: DC | PRN
  Administered 2022-01-02: 80 ug via INTRAVENOUS

## 2022-01-02 MED ORDER — FENTANYL CITRATE (PF) 100 MCG/2ML IJ SOLN
INTRAMUSCULAR | Status: AC
Start: 2022-01-02 — End: ?
  Filled 2022-01-02: qty 2

## 2022-01-02 MED ORDER — LIDOCAINE HCL (PF) 2 % IJ SOLN
INTRAMUSCULAR | Status: AC
  Filled 2022-01-02: qty 5

## 2022-01-02 MED ORDER — PROPOFOL 10 MG/ML IV BOLUS
INTRAVENOUS | Status: DC | PRN
  Administered 2022-01-02: 200 mg via INTRAVENOUS

## 2022-01-02 MED ORDER — ORAL CARE MOUTH RINSE
15.0000 mL | Freq: Once | OROMUCOSAL | Status: AC
  Administered 2022-01-02: 15 mL via OROMUCOSAL

## 2022-01-02 MED ORDER — SUGAMMADEX SODIUM 500 MG/5ML IV SOLN
INTRAVENOUS | Status: AC
  Filled 2022-01-02: qty 5

## 2022-01-02 MED ORDER — BUPIVACAINE HCL (PF) 0.5 % IJ SOLN
INTRAMUSCULAR | Status: DC | PRN
  Administered 2022-01-02: 17 mL

## 2022-01-02 MED ORDER — ONDANSETRON HCL 4 MG/2ML IJ SOLN
INTRAMUSCULAR | Status: DC | PRN
  Administered 2022-01-02: 4 mg via INTRAVENOUS

## 2022-01-02 MED ORDER — CHLORHEXIDINE GLUCONATE CLOTH 2 % EX PADS: 6.0000 | MEDICATED_PAD | Freq: Once | CUTANEOUS | Status: DC

## 2022-01-02 MED ORDER — HEMOSTATIC AGENTS (NO CHARGE) OPTIME
TOPICAL | Status: DC | PRN
  Administered 2022-01-02: 1 via TOPICAL

## 2022-01-02 MED ORDER — SUGAMMADEX SODIUM 200 MG/2ML IV SOLN
INTRAVENOUS | Status: DC | PRN
  Administered 2022-01-02: 250 mg via INTRAVENOUS

## 2022-01-02 MED ORDER — DEXAMETHASONE SODIUM PHOSPHATE 10 MG/ML IJ SOLN
INTRAMUSCULAR | Status: DC | PRN
  Administered 2022-01-02: 7 mg via INTRAVENOUS

## 2022-01-02 MED ORDER — FENTANYL CITRATE PF 50 MCG/ML IJ SOSY: 25.0000 ug | PREFILLED_SYRINGE | INTRAMUSCULAR | Status: DC | PRN

## 2022-01-02 MED ORDER — ONDANSETRON HCL 4 MG/2ML IJ SOLN
INTRAMUSCULAR | Status: AC
  Filled 2022-01-02: qty 2

## 2022-01-02 MED ORDER — PHENYLEPHRINE HCL (PRESSORS) 10 MG/ML IV SOLN
INTRAVENOUS | Status: AC
  Filled 2022-01-02: qty 1

## 2022-01-02 MED ORDER — BUPIVACAINE HCL (PF) 0.5 % IJ SOLN
INTRAMUSCULAR | Status: AC
  Filled 2022-01-02: qty 30

## 2022-01-02 MED ORDER — LABETALOL HCL 5 MG/ML IV SOLN
INTRAVENOUS | Status: DC | PRN
  Administered 2022-01-02 (×2): 5 mg via INTRAVENOUS

## 2022-01-02 MED ORDER — MIDAZOLAM HCL 5 MG/5ML IJ SOLN
INTRAMUSCULAR | Status: DC | PRN
  Administered 2022-01-02: 2 mg via INTRAVENOUS

## 2022-01-02 MED ORDER — PROPOFOL 10 MG/ML IV BOLUS
INTRAVENOUS | Status: AC
  Filled 2022-01-02: qty 20

## 2022-01-02 MED ORDER — FENTANYL CITRATE (PF) 100 MCG/2ML IJ SOLN
INTRAMUSCULAR | Status: DC | PRN
  Administered 2022-01-02: 50 ug via INTRAVENOUS
  Administered 2022-01-02: 100 ug via INTRAVENOUS

## 2022-01-02 MED ORDER — AMISULPRIDE (ANTIEMETIC) 5 MG/2ML IV SOLN: 10.0000 mg | Freq: Once | INTRAVENOUS | Status: DC | PRN

## 2022-01-02 MED ORDER — ROCURONIUM BROMIDE 10 MG/ML (PF) SYRINGE
PREFILLED_SYRINGE | INTRAVENOUS | Status: DC | PRN
  Administered 2022-01-02: 20 mg via INTRAVENOUS
  Administered 2022-01-02: 60 mg via INTRAVENOUS

## 2022-01-02 MED ORDER — OXYCODONE HCL 5 MG/5ML PO SOLN: 5.0000 mg | Freq: Once | ORAL | Status: DC | PRN

## 2022-01-02 MED ORDER — ROCURONIUM BROMIDE 10 MG/ML (PF) SYRINGE
PREFILLED_SYRINGE | INTRAVENOUS | Status: AC
  Filled 2022-01-02: qty 10

## 2022-01-02 MED ORDER — LIDOCAINE 2% (20 MG/ML) 5 ML SYRINGE
INTRAMUSCULAR | Status: DC | PRN
  Administered 2022-01-02: 60 mg via INTRAVENOUS

## 2022-01-02 MED ORDER — MIDAZOLAM HCL 2 MG/2ML IJ SOLN
INTRAMUSCULAR | Status: AC
  Filled 2022-01-02: qty 2

## 2022-01-02 MED ORDER — CHLORHEXIDINE GLUCONATE 0.12 % MT SOLN: 15.0000 mL | Freq: Once | OROMUCOSAL | Status: AC

## 2022-01-02 MED ORDER — FENTANYL CITRATE (PF) 100 MCG/2ML IJ SOLN
INTRAMUSCULAR | Status: AC
  Filled 2022-01-02: qty 2

## 2022-01-02 MED ORDER — DEXAMETHASONE SODIUM PHOSPHATE 10 MG/ML IJ SOLN
INTRAMUSCULAR | Status: AC
  Filled 2022-01-02: qty 1

## 2022-01-02 SURGICAL SUPPLY — 36 items
ATTRACTOMAT 16X20 MAGNETIC DRP (DRAPES) ×2 IMPLANT
BAG COUNTER SPONGE SURGICOUNT (BAG) ×2 IMPLANT
BLADE SURG 15 STRL LF DISP TIS (BLADE) ×1 IMPLANT
BLADE SURG 15 STRL SS (BLADE) ×2
CHLORAPREP W/TINT 26 (MISCELLANEOUS) ×2 IMPLANT
CLIP TI MEDIUM 6 (CLIP) ×5 IMPLANT
CLIP TI WIDE RED SMALL 6 (CLIP) ×5 IMPLANT
COVER SURGICAL LIGHT HANDLE (MISCELLANEOUS) ×2 IMPLANT
DERMABOND ADVANCED (GAUZE/BANDAGES/DRESSINGS) ×1
DERMABOND ADVANCED .7 DNX12 (GAUZE/BANDAGES/DRESSINGS) ×1 IMPLANT
DRAPE LAPAROTOMY T 98X78 PEDS (DRAPES) ×2 IMPLANT
DRAPE UTILITY XL STRL (DRAPES) ×2 IMPLANT
ELECT REM PT RETURN 15FT ADLT (MISCELLANEOUS) ×2 IMPLANT
GAUZE 4X4 16PLY ~~LOC~~+RFID DBL (SPONGE) ×2 IMPLANT
GLOVE SURG SYN 7.5  E (GLOVE) ×4
GLOVE SURG SYN 7.5 E (GLOVE) ×2 IMPLANT
GLOVE SURG SYN 7.5 PF PI (GLOVE) ×2 IMPLANT
GOWN STRL REUS W/ TWL XL LVL3 (GOWN DISPOSABLE) ×3 IMPLANT
GOWN STRL REUS W/TWL XL LVL3 (GOWN DISPOSABLE) ×3
HEMOSTAT SURGICEL 2X4 FIBR (HEMOSTASIS) ×2 IMPLANT
ILLUMINATOR WAVEGUIDE N/F (MISCELLANEOUS) IMPLANT
KIT BASIN OR (CUSTOM PROCEDURE TRAY) ×2 IMPLANT
KIT TURNOVER KIT A (KITS) IMPLANT
NDL HYPO 25X1 1.5 SAFETY (NEEDLE) ×1 IMPLANT
NEEDLE HYPO 25X1 1.5 SAFETY (NEEDLE) ×2 IMPLANT
PACK BASIC VI WITH GOWN DISP (CUSTOM PROCEDURE TRAY) ×2 IMPLANT
PENCIL SMOKE EVACUATOR (MISCELLANEOUS) ×2 IMPLANT
SUT MNCRL AB 4-0 PS2 18 (SUTURE) ×2 IMPLANT
SUT SILK 3 0 (SUTURE) ×1
SUT SILK 3-0 18XBRD TIE 12 (SUTURE) IMPLANT
SUT VIC AB 3-0 SH 18 (SUTURE) ×2 IMPLANT
SYR BULB IRRIG 60ML STRL (SYRINGE) ×2 IMPLANT
SYR CONTROL 10ML LL (SYRINGE) ×2 IMPLANT
TOWEL OR 17X26 10 PK STRL BLUE (TOWEL DISPOSABLE) ×2 IMPLANT
TOWEL OR NON WOVEN STRL DISP B (DISPOSABLE) ×2 IMPLANT
TUBING CONNECTING 10 (TUBING) ×2 IMPLANT

## 2022-01-02 NOTE — Interval H&P Note (Signed)
History and Physical Interval Note: ? ?01/02/2022 ?8:18 AM ? ?Shawn Barr  has presented today for surgery, with the diagnosis of PRIMARY HYPERPARATHYROIDISM.  The various methods of treatment have been discussed with the patient and family. After consideration of risks, benefits and other options for treatment, the patient has consented to  ? ? Procedure(s): ?RIGHT INFERIOR PARATHYROIDECTOMY (Right) as a surgical intervention.   ? ?The patient's history has been reviewed, patient examined, no change in status, stable for surgery.  I have reviewed the patient's chart and labs.  Questions were answered to the patient's satisfaction.   ? ?Armandina Gemma, MD ?[facility] ?A [facility] practice ?Office: [phone] ? ? ?Armandina Gemma ? ? ?

## 2022-01-02 NOTE — Transfer of Care (Signed)
Immediate Anesthesia Transfer of Care Note ? ?Patient: Shawn Barr ? ?Procedure(s) Performed: RIGHT PARATHYROIDECTOMY (Right: Neck) ? ?Patient Location: PACU ? ?Anesthesia Type:General ? ?Level of Consciousness: awake, alert  and patient cooperative ? ?Airway & Oxygen Therapy: Patient Spontanous Breathing and Patient connected to face mask oxygen ? ?Post-op Assessment: Report given to RN and Post -op Vital signs reviewed and stable ? ?Post vital signs: Reviewed and stable ? ?Last Vitals:  ?Vitals Value Taken Time  ?BP 133/86 01/02/22 1003  ?Temp    ?Pulse 77 01/02/22 1006  ?Resp    ?SpO2 97 % 01/02/22 1006  ?Vitals shown include unvalidated device data. ? ?Last Pain:  ?Vitals:  ? 01/02/22 0625  ?TempSrc: Oral  ?   ? ?  ? ?Complications: No notable events documented. ?

## 2022-01-02 NOTE — Op Note (Signed)
OPERATIVE REPORT - PARATHYROIDECTOMY ? ?Preoperative diagnosis: Primary hyperparathyroidism ? ?Postop diagnosis: Same ? ?Procedure: Right minimally invasive parathyroidectomy ? ?Surgeon:  Armandina Gemma, MD ? ?Assistant:  Cannon Kettle (PA Student) ? ?Anesthesia: General endotracheal ? ?Estimated blood loss: Minimal ? ?Preparation: ChloraPrep ? ?Indications: Patient is referred by Dr. Shon Baton for surgical evaluation and management of newly diagnosed primary hyperparathyroidism. Patient was noted on routine laboratory studies to have an elevated serum calcium level. His 2 most recent levels were 11.2 and 11.3. Intact PTH level was elevated at 103. Patient was sent for nuclear medicine parathyroid scan performed on October 21, 2021. This localized a right inferior parathyroid adenoma. USN confirmed a 2.6 cm adenoma on the right posterior to the right thyroid lobe. Patient has no prior history of endocrine disease. He has had no prior head or neck surgery. There is no family history of parathyroid disease or other endocrine neoplasms. Patient denies any symptoms. He denies fatigue. He denies bone and joint pain. He has no history of nephrolithiasis. Patient works for [employer].  ? ?Procedure: The patient was prepared in the pre-operative holding area. The patient was brought to the operating room and placed in a supine position on the operating room table. Following administration of general anesthesia, the patient was positioned and then prepped and draped in the usual strict aseptic fashion. After ascertaining that an adequate level of anesthesia been achieved, a neck incision was made with a #15 blade. Dissection was carried through subcutaneous tissues and platysma. Hemostasis was obtained with the electrocautery. Skin flaps were developed circumferentially and a Weitlander retractor was placed for exposure. ? ?Strap muscles were incised in the midline. Strap muscles were reflected  laterally exposing the thyroid lobe. With gentle blunt dissection the thyroid lobe was mobilized.  Dissection was carried posteriorly and an enlarged parathyroid gland was identified. It was gently mobilized. Vascular structures were divided between small ligaclips. Care was taken to avoid the recurrent laryngeal nerve. The parathyroid gland was completely excised. It was submitted to pathology where frozen section confirmed hypercellular parathyroid tissue consistent with adenoma. ? ?Neck was irrigated with warm saline and good hemostasis was noted. Fibrillar was placed in the operative field. Strap muscles were approximated in the midline with interrupted 3-0 Vicryl sutures. Platysma was closed with interrupted 3-0 Vicryl sutures. Marcaine was infiltrated circumferentially. Skin was closed with a running 4-0 Monocryl subcuticular suture. Wound was washed and dried and Dermabond was applied. Patient was awakened from anesthesia and brought to the recovery room. The patient tolerated the procedure well. ? ? ?Armandina Gemma, MD ?[facility] ?Office: [phone]  ?

## 2022-01-02 NOTE — Discharge Instructions (Signed)
[facility] - Dr. Nirav Sweda  THYROID & PARATHYROID SURGERY:  POST-OP INSTRUCTIONS  Always review the instruction sheet provided by the hospital nurse at discharge.  A prescription for pain medication may be sent to your pharmacy at the time of discharge.  Take your pain medication as prescribed.  If narcotic pain medicine is not needed, then you may take acetaminophen (Tylenol) or ibuprofen (Advil) as needed for pain or soreness.  Take your normal home medications as prescribed unless otherwise directed.  If you need a refill on your pain medication, please contact the office during regular business hours.  Prescriptions will not be processed by the office after 5:00PM or on weekends.  Start with a light diet upon arrival home, such as soup and crackers or toast.  Be sure to drink plenty of fluids.  Resume your normal diet the day after surgery.  Most patients will experience some swelling and bruising on the chest and neck area.  Ice packs will help for the first 48 hours after arriving home.  Swelling and bruising will take several days to resolve.   It is common to experience some constipation after surgery.  Increasing fluid intake and taking a stool softener (Colace) will usually help to prevent this problem.  A mild laxative (Milk of Magnesia or Miralax) should be taken according to package directions if there has been no bowel movement after 48 hours.  Dermabond glue covers your incision. This seals the wound and you may shower at any time. The Dermabond will remain in place for about a week.  You may gradually remove the glue when it loosens around the edges.  If you need to loosen the Dermabond for removal, apply a layer of Vaseline to the wound for 15 minutes and then remove with a Kleenex. Your sutures are under the skin and will not show - they will dissolve on their own.  You may resume light daily activities beginning the day after discharge (such as self-care,  walking, climbing stairs), gradually increasing activities as tolerated. You may have sexual intercourse when it is comfortable. Refrain from any heavy lifting or straining until approved by your doctor. You may drive when you no longer are taking prescription pain medication, you can comfortably wear a seatbelt, and you can safely maneuver your car and apply the brakes.  You will see your doctor in the office for a follow-up appointment approximately three weeks after your surgery.  Make sure that you call for this appointment within a day or two after you arrive home to insure a convenient appointment time. Please have any requested laboratory tests performed a few days prior to your office visit so that the results will be available at your follow up appointment.  WHEN TO CALL THE [facility] OFFICE: -- Fever greater than 101.5 -- Inability to urinate -- Nausea and/or vomiting - persistent -- Extreme swelling or bruising -- Continued bleeding from incision -- Increased pain, redness, or drainage from the incision -- Difficulty swallowing or breathing -- Muscle cramping or spasms -- Numbness or tingling in hands or around lips  The clinic staff is available to answer your questions during regular business hours.  Please don't hesitate to call and ask to speak to one of the nurses if you have concerns.  [facility] OFFICE: [phone] (24 hours)  Please sign up for MyChart accounts. This will allow you to communicate directly with my nurse or myself without having to call the office. It will also allow you   to view your test results. You will need to enroll in MyChart for my office ([facility]) and for the hospital ([facility]).  Baylen Buckner, MD [facility] A [facility] practice 

## 2022-01-02 NOTE — Anesthesia Procedure Notes (Signed)
Procedure Name: Intubation ?Date/Time: 01/02/2022 8:39 AM ?Performed by: West Pugh, CRNA ?Pre-anesthesia Checklist: Patient identified, Emergency Drugs available, Suction available, Patient being monitored and Timeout performed ?Patient Re-evaluated:Patient Re-evaluated prior to induction ?Oxygen Delivery Method: Circle system utilized ?Preoxygenation: Pre-oxygenation with 100% oxygen ?Induction Type: IV induction ?Ventilation: Oral airway inserted - appropriate to patient size and Two handed mask ventilation required ?Laryngoscope Size: Glidescope and 4 ?Grade View: Grade I ?Tube type: Oral ?Number of attempts: 1 ?Airway Equipment and Method: Rigid stylet and Video-laryngoscopy ?Placement Confirmation: ETT inserted through vocal cords under direct vision, positive ETCO2, CO2 detector and breath sounds checked- equal and bilateral ?Secured at: 22 cm ?Tube secured with: Tape ?Dental Injury: Teeth and Oropharynx as per pre-operative assessment  ?Difficulty Due To: Difficulty was anticipated and Difficult Airway- due to large tongue ? ? ? ? ?

## 2022-01-03 ENCOUNTER — Encounter (HOSPITAL_COMMUNITY): Payer: Self-pay | Admitting: Surgery

## 2022-01-03 LAB — SURGICAL PATHOLOGY

## 2022-01-03 NOTE — Anesthesia Postprocedure Evaluation (Signed)
Anesthesia Post Note ? ?Patient: Shawn Barr ? ?Procedure(s) Performed: RIGHT PARATHYROIDECTOMY (Right: Neck) ? ?  ? ?Patient location during evaluation: PACU ?Anesthesia Type: General ?Level of consciousness: awake ?Pain management: pain level controlled ?Vital Signs Assessment: post-procedure vital signs reviewed and stable ?Respiratory status: spontaneous breathing, nonlabored ventilation, respiratory function stable and patient connected to nasal cannula oxygen ?Cardiovascular status: blood pressure returned to baseline and stable ?Postop Assessment: no apparent nausea or vomiting ?Anesthetic complications: no ? ? ?No notable events documented. ? ?Last Vitals:  ?Vitals:  ? 01/02/22 1030 01/02/22 1045  ?BP: 131/87 124/75  ?Pulse: 70 69  ?Resp: 15 16  ?Temp:  36.7 ?C  ?SpO2: 94% 94%  ?  ?Last Pain:  ?Vitals:  ? 01/02/22 1045  ?TempSrc:   ?PainSc: 2   ? ? ?  ?  ?  ?  ?  ?  ? ?Shawn Barr ? ? ? ? ?

## 2022-01-08 NOTE — Progress Notes (Signed)
? ? ? ?  Final diagnosis: ? ?Parathyroid adenoma ? ?Armandina Gemma, MD ?[facility] ?A [facility] practice ?Office: [phone] ? ?

## 2022-05-12 ENCOUNTER — Encounter: Payer: Self-pay | Admitting: *Deleted

## 2022-05-13 ENCOUNTER — Encounter: Payer: Managed Care, Other (non HMO) | Admitting: Adult Health

## 2022-05-13 NOTE — Progress Notes (Deleted)
PATIENT: Shawn Barr DOB: 1971-04-10  REASON FOR VISIT: follow up HISTORY FROM: patient PRIMARY NEUROLOGIST:   Virtual Visit via Video Note  I connected with [place] on 05/13/22 at  2:15 PM EDT by a video enabled telemedicine application located remotely at [facility] and verified that I am speaking with the correct person using two identifiers who was located at their own home.   I discussed the limitations of evaluation and management by telemedicine and the availability of in person appointments. The patient expressed understanding and agreed to proceed.   PATIENT: Shawn Barr DOB: 07-Sep-1971  REASON FOR VISIT: follow up HISTORY FROM: patient  Primary Neurologist: Dr. Brett Fairy   HISTORY OF PRESENT ILLNESS: Today 05/13/22:  Shawn Barr is a 51 year old male with a history of obstructive sleep apnea on CPAP.  He returns today for follow-up.  He reports that his CPAP is working well for him.  He denies any new issues.  His download is below    HISTORY 02/29/20:   Shawn Barr is a 51 year old male with a history of obstructive sleep apnea on CPAP.  His download indicates that he uses machine 29 out of 30 days for compliance of 97%.  He uses machine greater than 4 hours 25 days for compliance of 83%.  On average he uses his machine 5 hours and 38 minutes.  His residual AHI is 0.9 on 6 to 14 cm of water with EPR 3.  Leak in the 95th percentile is 0.1 L/min he reports that the CPAP works well for him.  He does not like going without it.  Does report that his insurance is no longer in network with [facility] he will need to switch to another DME company     REVIEW OF SYSTEMS: Out of a complete 14 system review of symptoms, the patient complains only of the following symptoms, and all other reviewed systems are negative.  ESS 5  ALLERGIES: Allergies  Allergen Reactions   Milk-Related Compounds Diarrhea   Peanut-Containing  Drug Products Other (See Comments)    Allergy test    HOME MEDICATIONS: Outpatient Medications Prior to Visit  Medication Sig Dispense Refill   albuterol (PROVENTIL HFA;VENTOLIN HFA) 108 (90 BASE) MCG/ACT inhaler Inhale 1-2 puffs into the lungs every 6 (six) hours as needed for wheezing or shortness of breath.      amLODipine (NORVASC) 5 MG tablet Take 5 mg by mouth daily.     atorvastatin (LIPITOR) 80 MG tablet Take 80 mg by mouth daily.     fexofenadine (ALLEGRA) 180 MG tablet Take 180 mg by mouth daily.     fluticasone-salmeterol (ADVAIR HFA) 115-21 MCG/ACT inhaler Inhale 1 puff into the lungs daily as needed (wheezing).     hydrALAZINE (APRESOLINE) 25 MG tablet Take 25 mg by mouth at bedtime.     spironolactone (ALDACTONE) 25 MG tablet Take 25 mg by mouth daily.     traMADol (ULTRAM) 50 MG tablet Take 1-2 tablets (50-100 mg total) by mouth every 6 (six) hours as needed for moderate pain. 15 tablet 0   No facility-administered medications prior to visit.    PAST MEDICAL HISTORY: Past Medical History:  Diagnosis Date   Allergic rhinitis    Asthma    Broken arm    Left   Chronic LBP    Daytime somnolence    Elevated LFTs    Fatty liver   Gynecomastia    Heart murmur  as teen   Hyperlipidemia    Hypertension    Hypertriglyceridemia    Hypothyroidism    OSA (obstructive sleep apnea)    CPAP   Umbilical hernia     PAST SURGICAL HISTORY: Past Surgical History:  Procedure Laterality Date   ANKLE SURGERY  1995   ANTERIOR LAT LUMBAR FUSION Right 07/11/2020   Procedure: RIGHT-LUMBAR 4- LUMBAR 5 LATERAL INTERBODY FUSION WITH INSTRUMENTATION AND ALLOGRAFT;  Surgeon: Phylliss Bob, MD;  Location: [place];  Service: Orthopedics;  Laterality: Right;   arm surgery  2000's   HAND SURGERY  1992   PARATHYROIDECTOMY Right 01/02/2022   Procedure: RIGHT PARATHYROIDECTOMY;  Surgeon: Armandina Gemma, MD;  Location: [facility];  Service: General;  Laterality: Right;   TONSILLECTOMY  1991     FAMILY HISTORY: Family History  Problem Relation Age of Onset   Hypertension Father    Diabetes Father    Sleep apnea Father    Glaucoma Mother    Diabetes Other    Diabetes Other     SOCIAL HISTORY: Social History   Socioeconomic History   Marital status: Married    Spouse name: [name]   Number of children: 1   Years of education: College   Highest education level: Not on file  Occupational History   Not on file  Tobacco Use   Smoking status: Never   Smokeless tobacco: Never  Vaping Use   Vaping Use: Never used  Substance and Sexual Activity   Alcohol use: Yes    Alcohol/week: 4.0 standard drinks of alcohol    Types: 4 Standard drinks or equivalent per week   Drug use: No   Sexual activity: Yes  Other Topics Concern   Not on file  Social History Narrative   Patient is married [name]) and lives at home with his wife and one child. Patient is working full-time. Patient has a college education. Patient is right-handed. Patient drinks 2-3 cups of tea daily.   Social Determinants of Health   Financial Resource Strain: Not on file  Food Insecurity: Not on file  Transportation Needs: Not on file  Physical Activity: Not on file  Stress: Not on file  Social Connections: Not on file  Intimate Partner Violence: Not on file      PHYSICAL EXAM Generalized: Well developed, in no acute distress   Neurological examination  Mentation: Alert oriented to time, place, history taking. Follows all commands speech and language fluent Cranial nerve II-XII:Extraocular movements were full. Facial symmetry noted. uvula tongue midline. Head turning and shoulder shrug  were normal and symmetric. Motor: Good strength throughout subjectively per patient Sensory: Sensory testing is intact to soft touch on all 4 extremities subjectively per patient Coordination: Cerebellar testing reveals good finger-nose-finger  Gait and station: Patient is able to stand from a seated position. gait  is normal.  Reflexes: UTA  DIAGNOSTIC DATA (LABS, IMAGING, TESTING) - I reviewed patient records, labs, notes, testing and imaging myself where available.  Lab Results  Component Value Date   WBC 5.4 12/20/2021   HGB 13.2 12/20/2021   HCT 41.0 12/20/2021   MCV 86.5 12/20/2021   PLT 277 12/20/2021      Component Value Date/Time   NA 137 12/20/2021 0855   K 3.5 12/20/2021 0855   CL 107 12/20/2021 0855   CO2 23 12/20/2021 0855   GLUCOSE 97 12/20/2021 0855   BUN 11 12/20/2021 0855   CREATININE 0.55 (L) 12/20/2021 0855   CALCIUM 10.8 (H) 12/20/2021 4270  PROT 6.6 07/11/2020 0700   ALBUMIN 3.8 07/11/2020 0700   AST 47 (H) 07/11/2020 0700   ALT 52 (H) 07/11/2020 0700   ALKPHOS 61 07/11/2020 0700   BILITOT 0.8 07/11/2020 0700   GFRNONAA >60 12/20/2021 0855   No results found for: "CHOL", "HDL", "LDLCALC", "LDLDIRECT", "TRIG", "CHOLHDL" No results found for: "HGBA1C" No results found for: "VITAMINB12" No results found for: "TSH"    ASSESSMENT AND PLAN 51 y.o. year old male  has a past medical history of Allergic rhinitis, Asthma, Broken arm, Chronic LBP, Daytime somnolence, Elevated LFTs, Gynecomastia, Heart murmur, Hyperlipidemia, Hypertension, Hypertriglyceridemia, Hypothyroidism, OSA (obstructive sleep apnea), and Umbilical hernia. here with:  OSA on CPAP  CPAP compliance excellent Residual AHI is good Encouraged patient to continue using CPAP nightly and > 4 hours each night F/U in 1 year or sooner if needed   Ward Givens, MSN, NP-C 05/13/2022, 2:10 PM [facility] [address] [place], [place] [address] [phone]

## 2022-05-14 NOTE — Progress Notes (Signed)
This encounter was created in error - please disregard.

## 2022-05-15 ENCOUNTER — Encounter: Payer: Self-pay | Admitting: Adult Health

## 2022-05-15 ENCOUNTER — Ambulatory Visit: Payer: Managed Care, Other (non HMO) | Admitting: Adult Health

## 2022-05-15 VITALS — BP 144/95 | HR 73 | Ht 69.0 in | Wt 238.0 lb

## 2022-05-15 DIAGNOSIS — G4733 Obstructive sleep apnea (adult) (pediatric): Secondary | ICD-10-CM | POA: Diagnosis not present

## 2022-05-15 DIAGNOSIS — Z9989 Dependence on other enabling machines and devices: Secondary | ICD-10-CM | POA: Diagnosis not present

## 2022-05-15 NOTE — Progress Notes (Signed)
PATIENT: Shawn Barr DOB: 1971-04-20  REASON FOR VISIT: follow up HISTORY FROM: patient PRIMARY NEUROLOGIST: Dr. Brett Fairy  Chief Complaint  Patient presents with   Follow-up    Pt in 20  pt here for CPAP f/u Pt states no questions or concerns for this visit      HISTORY OF PRESENT ILLNESS: Today 05/15/22:  Shawn Barr is a 51 year old male with a history of obstructive sleep apnea on CPAP.  He returns today for follow-up.  He reports that the CPAP is working well.  He continues to notice the benefit.  Reports that he feels well rested the next day.  He has had this machine since 2015   REVIEW OF SYSTEMS: Out of a complete 14 system review of symptoms, the patient complains only of the following symptoms, and all other reviewed systems are negative.  FSS 13 ESS 2   ALLERGIES: Allergies  Allergen Reactions   Milk-Related Compounds Diarrhea   Peanut-Containing Drug Products Other (See Comments)    Allergy test    HOME MEDICATIONS: Outpatient Medications Prior to Visit  Medication Sig Dispense Refill   albuterol (PROVENTIL HFA;VENTOLIN HFA) 108 (90 BASE) MCG/ACT inhaler Inhale 1-2 puffs into the lungs every 6 (six) hours as needed for wheezing or shortness of breath.      amLODipine (NORVASC) 5 MG tablet Take 5 mg by mouth daily.     atorvastatin (LIPITOR) 80 MG tablet Take 80 mg by mouth daily.     fexofenadine (ALLEGRA) 180 MG tablet Take 180 mg by mouth daily.     fluticasone-salmeterol (ADVAIR HFA) 115-21 MCG/ACT inhaler Inhale 1 puff into the lungs daily as needed (wheezing).     hydrALAZINE (APRESOLINE) 25 MG tablet Take 25 mg by mouth at bedtime.     spironolactone (ALDACTONE) 25 MG tablet Take 25 mg by mouth daily.     traMADol (ULTRAM) 50 MG tablet Take 1-2 tablets (50-100 mg total) by mouth every 6 (six) hours as needed for moderate pain. 15 tablet 0   No facility-administered medications prior to visit.    PAST MEDICAL HISTORY: Past Medical History:   Diagnosis Date   Allergic rhinitis    Asthma    Broken arm    Left   Chronic LBP    Daytime somnolence    Elevated LFTs    Fatty liver   Gynecomastia    Heart murmur    as teen   Hyperlipidemia    Hypertension    Hypertriglyceridemia    Hypothyroidism    OSA (obstructive sleep apnea)    CPAP   Umbilical hernia     PAST SURGICAL HISTORY: Past Surgical History:  Procedure Laterality Date   ANKLE SURGERY  1995   ANTERIOR LAT LUMBAR FUSION Right 07/11/2020   Procedure: RIGHT-LUMBAR 4- LUMBAR 5 LATERAL INTERBODY FUSION WITH INSTRUMENTATION AND ALLOGRAFT;  Surgeon: Phylliss Bob, MD;  Location: [place];  Service: Orthopedics;  Laterality: Right;   arm surgery  2000's   HAND SURGERY  1992   PARATHYROIDECTOMY Right 01/02/2022   Procedure: RIGHT PARATHYROIDECTOMY;  Surgeon: Armandina Gemma, MD;  Location[facility];  Service: General;  Laterality: Right;   TONSILLECTOMY  1991    FAMILY HISTORY: Family History  Problem Relation Age of Onset   Hypertension Father    Diabetes Father    Sleep apnea Father    Glaucoma Mother    Diabetes Other    Diabetes Other     SOCIAL HISTORY: Social History  Socioeconomic History   Marital status: Married    Spouse name: [name]   Number of children: 1   Years of education: College   Highest education level: Not on file  Occupational History   Not on file  Tobacco Use   Smoking status: Never   Smokeless tobacco: Never  Vaping Use   Vaping Use: Never used  Substance and Sexual Activity   Alcohol use: Yes    Alcohol/week: 4.0 standard drinks of alcohol    Types: 4 Standard drinks or equivalent per week    Comment: occ   Drug use: No   Sexual activity: Yes  Other Topics Concern   Not on file  Social History Narrative   Patient is married [name]) and lives at home with his wife and one child. Patient is working full-time. Patient has a college education. Patient is right-handed. Patient drinks 2-3 cups of tea daily.   Social  Determinants of Health   Financial Resource Strain: Not on file  Food Insecurity: Not on file  Transportation Needs: Not on file  Physical Activity: Not on file  Stress: Not on file  Social Connections: Not on file  Intimate Partner Violence: Not on file      PHYSICAL EXAM  Vitals:   05/15/22 0952  BP: (!) 144/95  Pulse: 73  Weight: 238 lb (108 kg)  Height: '5\' 9"'$  (1.753 m)   Body mass index is 35.15 kg/m.  Generalized: Well developed, in no acute distress  Chest: Lungs clear to auscultation bilaterally  Neurological examination  Mentation: Alert oriented to time, place, history taking. Follows all commands speech and language fluent Cranial nerve II-XII: Extraocular movements were full, visual field were full on confrontational test Head turning and shoulder shrug  were normal and symmetric.  Mallampatti 3 + Motor: The motor testing reveals 5 over 5 strength of all 4 extremities. Good symmetric motor tone is noted throughout.  Sensory: Sensory testing is intact to soft touch on all 4 extremities. No evidence of extinction is noted.  Gait and station: Gait is normal.    DIAGNOSTIC DATA (LABS, IMAGING, TESTING) - I reviewed patient records, labs, notes, testing and imaging myself where available.  Lab Results  Component Value Date   WBC 5.4 12/20/2021   HGB 13.2 12/20/2021   HCT 41.0 12/20/2021   MCV 86.5 12/20/2021   PLT 277 12/20/2021      Component Value Date/Time   NA 137 12/20/2021 0855   K 3.5 12/20/2021 0855   CL 107 12/20/2021 0855   CO2 23 12/20/2021 0855   GLUCOSE 97 12/20/2021 0855   BUN 11 12/20/2021 0855   CREATININE 0.55 (L) 12/20/2021 0855   CALCIUM 10.8 (H) 12/20/2021 0855   PROT 6.6 07/11/2020 0700   ALBUMIN 3.8 07/11/2020 0700   AST 47 (H) 07/11/2020 0700   ALT 52 (H) 07/11/2020 0700   ALKPHOS 61 07/11/2020 0700   BILITOT 0.8 07/11/2020 0700   GFRNONAA >60 12/20/2021 0859    51 y.o. year old male  has a past medical history of Allergic  rhinitis, Asthma, Broken arm, Chronic LBP, Daytime somnolence, Elevated LFTs, Gynecomastia, Heart murmur, Hyperlipidemia, Hypertension, Hypertriglyceridemia, Hypothyroidism, OSA (obstructive sleep apnea), and Umbilical hernia. here with:  OSA on CPAP  - CPAP compliance excellent - Good treatment of AHI  - Encourage patient to use CPAP nightly and > 4 hours each night - HST ordered pending results will order new machine - F/U in 1 year or sooner if needed  Ward Givens, MSN, NP-C 05/15/2022, 9:57 AM [facility] [address], [place], [place] [address] [phone]

## 2022-06-24 ENCOUNTER — Encounter: Payer: Self-pay | Admitting: Internal Medicine

## 2022-07-10 ENCOUNTER — Ambulatory Visit (AMBULATORY_SURGERY_CENTER): Payer: Self-pay

## 2022-07-10 VITALS — Ht 69.0 in | Wt 239.0 lb

## 2022-07-10 DIAGNOSIS — Z1211 Encounter for screening for malignant neoplasm of colon: Secondary | ICD-10-CM

## 2022-07-10 MED ORDER — NA SULFATE-K SULFATE-MG SULF 17.5-3.13-1.6 GM/177ML PO SOLN: 1.0000 | Freq: Once | ORAL | 0 refills | Status: AC

## 2022-07-10 NOTE — Progress Notes (Signed)
No egg or soy allergy known to patient  No issues known to pt with past sedation with any surgeries or procedures Patient denies ever being told they had issues or difficulty with intubation  No FH of Malignant Hyperthermia Pt is not on diet pills Pt is not on home 02  Pt is not on blood thinners  Pt denies issues with constipation  No A fib or A flutter Have any cardiac testing pending--NO Pt instructed to use [email or web address] or GoodRx for a price reduction on prep  Insurance verified during PV appt=[employer]

## 2022-07-18 ENCOUNTER — Encounter: Payer: Self-pay | Admitting: Internal Medicine

## 2022-07-28 ENCOUNTER — Encounter: Payer: Self-pay | Admitting: Internal Medicine

## 2022-07-28 ENCOUNTER — Ambulatory Visit: Payer: Managed Care, Other (non HMO) | Admitting: Internal Medicine

## 2022-07-28 VITALS — BP 145/88 | HR 72 | Temp 96.6°F | Resp 11 | Ht 69.0 in | Wt 239.0 lb

## 2022-07-28 DIAGNOSIS — D122 Benign neoplasm of ascending colon: Secondary | ICD-10-CM

## 2022-07-28 DIAGNOSIS — Z1211 Encounter for screening for malignant neoplasm of colon: Secondary | ICD-10-CM

## 2022-07-28 DIAGNOSIS — K635 Polyp of colon: Secondary | ICD-10-CM | POA: Diagnosis not present

## 2022-07-28 DIAGNOSIS — D125 Benign neoplasm of sigmoid colon: Secondary | ICD-10-CM

## 2022-07-28 MED ORDER — SODIUM CHLORIDE 0.9 % IV SOLN: 500.0000 mL | INTRAVENOUS | Status: AC

## 2022-07-28 NOTE — Op Note (Signed)
[place] Patient Name: Shawn Barr Procedure Date: 07/28/2022 8:00 AM MRN: [id number] Endoscopist: Adline Mango [place] , , [id number] Age: 51 Referring MD:  Date of Birth: Nov 10, 1970 Gender: Male Account #: [id number] Procedure:                Colonoscopy Indications:              Screening for colorectal malignant neoplasm, This                            is the patient's first colonoscopy Medicines:                Monitored Anesthesia Care Procedure:                Pre-Anesthesia Assessment:                           - Prior to the procedure, a History and Physical                            was performed, and patient medications and                            allergies were reviewed. The patient's tolerance of                            previous anesthesia was also reviewed. The risks                            and benefits of the procedure and the sedation                            options and risks were discussed with the patient.                            All questions were answered, and informed consent                            was obtained. Prior Anticoagulants: The patient has                            taken no anticoagulant or antiplatelet agents. ASA                            Grade Assessment: II - A patient with mild systemic                            disease. After reviewing the risks and benefits,                            the patient was deemed in satisfactory condition to                            undergo the procedure.  After obtaining informed consent, the colonoscope                            was passed under direct vision. Throughout the                            procedure, the patient's blood pressure, pulse, and                            oxygen saturations were monitored continuously. The                            Olympus CF-HQ190L ([id number]) Colonoscope was                            introduced through the  anus and advanced to the the                            cecum, identified by appendiceal orifice and                            ileocecal valve. The colonoscopy was performed                            without difficulty. The patient tolerated the                            procedure well. The quality of the bowel                            preparation was good. The ileocecal valve,                            appendiceal orifice, and rectum were photographed. Scope In: 8:10:51 AM Scope Out: 8:33:57 AM Scope Withdrawal Time: 0 hours 17 minutes 40 seconds  Total Procedure Duration: 0 hours 23 minutes 6 seconds  Findings:                 Three sessile polyps were found in the ascending                            colon. The polyps were 3 to 6 mm in size. These                            polyps were removed with a cold snare. Resection                            and retrieval were complete.                           An 8 mm polyp was found in the ascending colon. The                            polyp was pedunculated. The  polyp was removed with                            a hot snare. Resection and retrieval were complete.                           A 3 mm polyp was found in the sigmoid colon. The                            polyp was sessile. The polyp was removed with a                            cold snare. Resection and retrieval were complete.                           Multiple diverticula were found in the sigmoid                            colon and descending colon.                           Non-bleeding internal hemorrhoids were found during                            retroflexion. Complications:            No immediate complications. Estimated Blood Loss:     Estimated blood loss was minimal. Impression:               - Three 3 to 6 mm polyps in the ascending colon,                            removed with a cold snare. Resected and retrieved.                           - One 8 mm polyp  in the ascending colon, removed                            with a hot snare. Resected and retrieved.                           - One 3 mm polyp in the sigmoid colon, removed with                            a cold snare. Resected and retrieved.                           - Diverticulosis in the sigmoid colon and in the                            descending colon.                           - Non-bleeding internal hemorrhoids. Recommendation:           -  Discharge patient to home (with escort).                           - Await pathology results.                           - The findings and recommendations were discussed                            with the patient. Dr Georgian Co "[address]" Lorenso Courier,  07/28/2022 8:38:24 AM

## 2022-07-28 NOTE — Patient Instructions (Signed)
YOU HAD AN ENDOSCOPIC PROCEDURE TODAY AT [facility]:   Refer to the procedure report that was given to you for any specific questions about what was found during the examination.  If the procedure report does not answer your questions, please call your gastroenterologist to clarify.  If you requested that your care partner not be given the details of your procedure findings, then the procedure report has been included in a sealed envelope for you to review at your convenience later.  YOU SHOULD EXPECT: Some feelings of bloating in the abdomen. Passage of more gas than usual.  Walking can help get rid of the air that was put into your GI tract during the procedure and reduce the bloating. If you had a lower endoscopy (such as a colonoscopy or flexible sigmoidoscopy) you may notice spotting of blood in your stool or on the toilet paper. If you underwent a bowel prep for your procedure, you may not have a normal bowel movement for a few days.  Please Note:  You might notice some irritation and congestion in your nose or some drainage.  This is from the oxygen used during your procedure.  There is no need for concern and it should clear up in a day or so.  SYMPTOMS TO REPORT IMMEDIATELY:  Following lower endoscopy (colonoscopy or flexible sigmoidoscopy):  Excessive amounts of blood in the stool  Significant tenderness or worsening of abdominal pains  Swelling of the abdomen that is new, acute  Fever of 100F or higher   For urgent or emergent issues, a gastroenterologist can be reached at any hour by calling [phone]. Do not use MyChart messaging for urgent concerns.    DIET:  We do recommend a small meal at first, but then you may proceed to your regular diet.  Drink plenty of fluids but you should avoid alcoholic beverages for 24 hours.  MEDICATIONS: Continue present medications.  Please see handouts given to you by your recovery nurse: Polyps, diverticulitis,  hemorrhoids.  Thank you for allowing [place] to provide for your healthcare needs today. ACTIVITY:  You should plan to take it easy for the rest of today and you should NOT DRIVE or use heavy machinery until tomorrow (because of the sedation medicines used during the test).    FOLLOW UP: Our staff will call the number listed on your records the next business day following your procedure.  We will call around 7:15- 8:00 am to check on you and address any questions or concerns that you may have regarding the information given to you following your procedure. If we do not reach you, we will leave a message.     If any biopsies were taken you will be contacted by phone or by letter within the next 1-3 weeks.  Please call us at [phone] if you have not heard about the biopsies in 3 weeks.    SIGNATURES/CONFIDENTIALITY: You and/or your care partner have signed paperwork which will be entered into your electronic medical record.  These signatures attest to the fact that that the information above on your After Visit Summary has been reviewed and is understood.  Full responsibility of the confidentiality of this discharge information lies with you and/or your care-partner.

## 2022-07-28 NOTE — Progress Notes (Signed)
Called to room to assist during endoscopic procedure.  Patient ID and intended procedure confirmed with present staff. Received instructions for my participation in the procedure from the performing physician.  

## 2022-07-28 NOTE — Progress Notes (Signed)
GASTROENTEROLOGY PROCEDURE H&P NOTE   Primary Care Physician: Shon Baton, MD    Reason for Procedure:   Colon cancer screening  Plan:    Colonoscopy  Patient is appropriate for endoscopic procedure(s) in the ambulatory ([place]) setting.  The nature of the procedure, as well as the risks, benefits, and alternatives were carefully and thoroughly reviewed with the patient. Ample time for discussion and questions allowed. The patient understood, was satisfied, and agreed to proceed.     HPI: Shawn Barr is a 51 y.o. male who presents for colonoscopy for colon cancer screening. Denies blood in stools, changes in bowel habits, or unintentional weight loss. Denies family history of colon cancer.  Past Medical History:  Diagnosis Date   Allergic rhinitis    Asthma    uses inhaler PRN   Broken arm    Left   Chronic LBP    Daytime somnolence    Elevated LFTs    Fatty liver   Gynecomastia    Heart murmur    as teen   Hyperlipidemia    on meds   Hypertension    on meds   Hypertriglyceridemia    on meds   Hypothyroidism    OSA (obstructive sleep apnea)    CPAP   Seasonal allergies    Sleep apnea    uses CPAP   Umbilical hernia     Past Surgical History:  Procedure Laterality Date   ANKLE SURGERY  09/29/1993   ANTERIOR LAT LUMBAR FUSION Right 07/11/2020   Procedure: RIGHT-LUMBAR 4- LUMBAR 5 LATERAL INTERBODY FUSION WITH INSTRUMENTATION AND ALLOGRAFT;  Surgeon: Phylliss Bob, MD;  Location: [facility];  Service: Orthopedics;  Laterality: Right;   arm surgery  05/31/1999   HAND SURGERY  09/29/1990   PARATHYROIDECTOMY Right 01/02/2022   Procedure: RIGHT PARATHYROIDECTOMY;  Surgeon: Armandina Gemma, MD;  Location: [facility];  Service: General;  Laterality: Right;   TONSILLECTOMY  09/29/1989   WISDOM TOOTH EXTRACTION      Prior to Admission medications   Medication Sig Start Date End Date Taking? Authorizing Provider  amLODipine (NORVASC) 5 MG tablet Take 5 mg by mouth  daily. 02/09/17  Yes [provider]  atorvastatin (LIPITOR) 80 MG tablet Take 80 mg by mouth daily.   Yes [provider]  fexofenadine (ALLEGRA) 180 MG tablet Take 180 mg by mouth daily.   Yes [provider]  hydrALAZINE (APRESOLINE) 25 MG tablet Take 25 mg by mouth at bedtime. 04/30/20  Yes [provider]  albuterol (PROVENTIL HFA;VENTOLIN HFA) 108 (90 BASE) MCG/ACT inhaler Inhale 1-2 puffs into the lungs every 6 (six) hours as needed for wheezing or shortness of breath.     [provider]  fluticasone-salmeterol (ADVAIR HFA) 115-21 MCG/ACT inhaler Inhale 1 puff into the lungs daily as needed (wheezing).    [provider]    Current Outpatient Medications  Medication Sig Dispense Refill   amLODipine (NORVASC) 5 MG tablet Take 5 mg by mouth daily.     atorvastatin (LIPITOR) 80 MG tablet Take 80 mg by mouth daily.     fexofenadine (ALLEGRA) 180 MG tablet Take 180 mg by mouth daily.     hydrALAZINE (APRESOLINE) 25 MG tablet Take 25 mg by mouth at bedtime.     albuterol (PROVENTIL HFA;VENTOLIN HFA) 108 (90 BASE) MCG/ACT inhaler Inhale 1-2 puffs into the lungs every 6 (six) hours as needed for wheezing or shortness of breath.      fluticasone-salmeterol (ADVAIR HFA) 115-21 MCG/ACT  inhaler Inhale 1 puff into the lungs daily as needed (wheezing).     Current Facility-Administered Medications  Medication Dose Route Frequency Provider Last Rate Last Admin   0.9 %  sodium chloride infusion  500 mL Intravenous Continuous Sharyn Creamer, MD        Allergies as of 07/28/2022 - Review Complete 07/28/2022  Allergen Reaction Noted   Milk-related compounds Diarrhea 07/04/2020   Peanut-containing drug products Other (See Comments) 07/04/2020    Family History  Problem Relation Age of Onset   Glaucoma Mother    Hypertension Father    Diabetes Father    Sleep apnea Father    Diabetes Other    Diabetes Other    Colon polyps Neg Hx    Colon  cancer Neg Hx    Esophageal cancer Neg Hx    Stomach cancer Neg Hx    Rectal cancer Neg Hx     Social History   Socioeconomic History   Marital status: Married    Spouse name: [name]   Number of children: 1   Years of education: College   Highest education level: Not on file  Occupational History   Not on file  Tobacco Use   Smoking status: Never   Smokeless tobacco: Never  Vaping Use   Vaping Use: Never used  Substance and Sexual Activity   Alcohol use: Not Currently    Alcohol/week: 0.0 - 6.0 standard drinks of alcohol    Comment: occ   Drug use: No   Sexual activity: Yes  Other Topics Concern   Not on file  Social History Narrative   Patient is married [name]) and lives at home with his wife and one child. Patient is working full-time. Patient has a college education. Patient is right-handed. Patient drinks 2-3 cups of tea daily.   Social Determinants of Health   Financial Resource Strain: Not on file  Food Insecurity: Not on file  Transportation Needs: Not on file  Physical Activity: Not on file  Stress: Not on file  Social Connections: Not on file  Intimate Partner Violence: Not on file    Physical Exam: Vital signs in last 24 hours: BP 139/81   Pulse 82   Temp (!) 96.6 F (35.9 C)   Ht '5\' 9"'$  (1.753 m)   Wt 239 lb (108.4 kg)   SpO2 96%   BMI 35.29 kg/m  GEN: NAD EYE: Sclerae anicteric ENT: MMM CV: Non-tachycardic Pulm: No increased work of breathing GI: Soft, NT/ND NEURO:  Alert & Oriented   Christia Reading, MD [facility]  07/28/2022 8:04 AM

## 2022-07-28 NOTE — Progress Notes (Signed)
Report to PACU, RN, vss, BBS= Clear.  

## 2022-07-28 NOTE — Progress Notes (Signed)
Pt's states no medical or surgical changes since previsit or office visit. 

## 2022-07-29 ENCOUNTER — Telehealth: Payer: Self-pay

## 2022-07-29 NOTE — Telephone Encounter (Signed)
Follow up call to pt, lm for pt to call if having any difficulty with normal activities or eating and drinking.  Also to call if any other questions or concerns.  

## 2022-07-30 ENCOUNTER — Encounter: Payer: Self-pay | Admitting: Internal Medicine

## 2022-08-13 IMAGING — US US THYROID
1 series · 13 of 25 positions shown · non-contrast
Comparison: 10/21/2021 nuclear medicine parathyroid scan

CLINICAL DATA: Hyperparathyroidism, parathyroid adenoma by nuclear
medicine parathyroid scan

EXAM:
THYROID ULTRASOUND
TECHNIQUE: Ultrasound examination of the thyroid gland and adjacent soft
tissues was performed.

[Series 1: us thyroid · 0.06mm/px · 61 acquisitions, 13 frames shown]
[im 1/61]
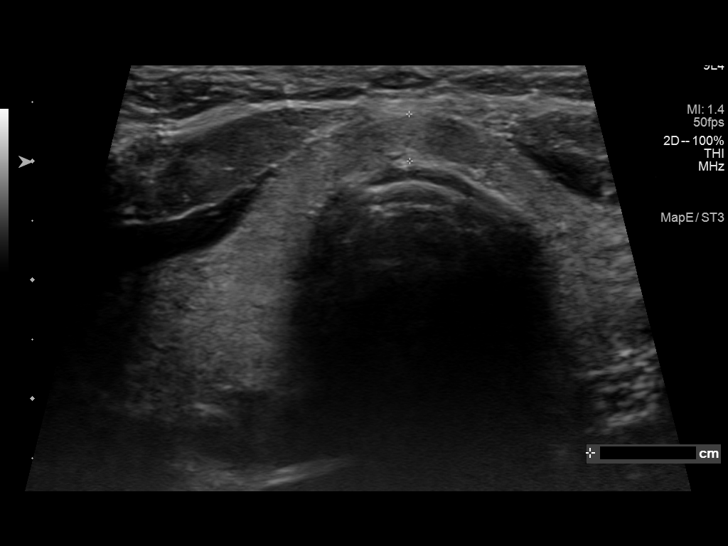
[im 6/61]
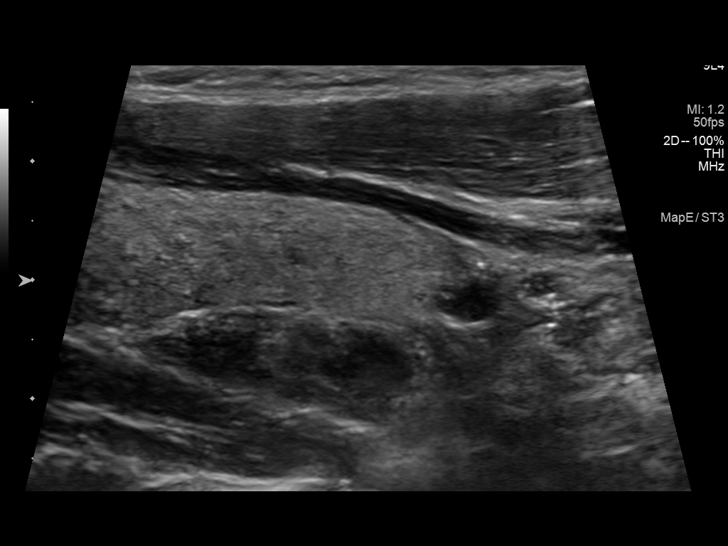
[im 11/61]
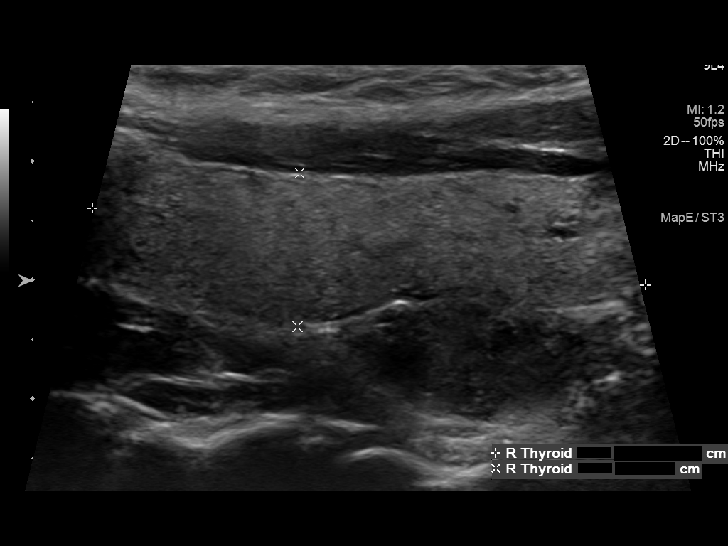
[im 16/61]
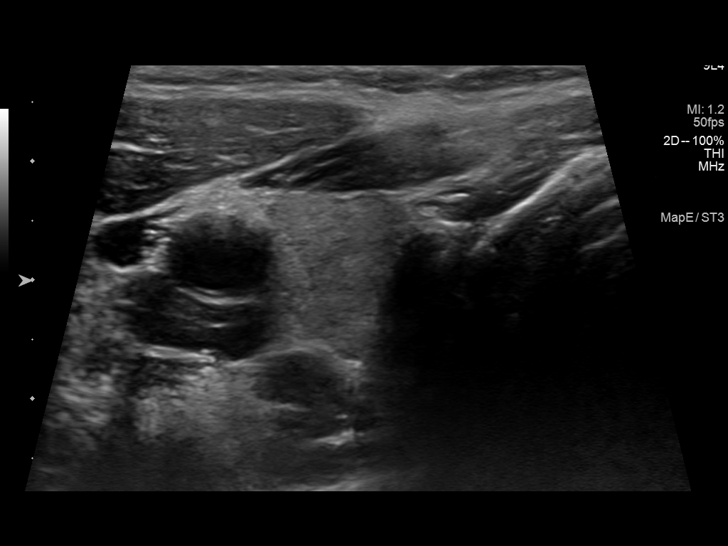
[im 21/61]
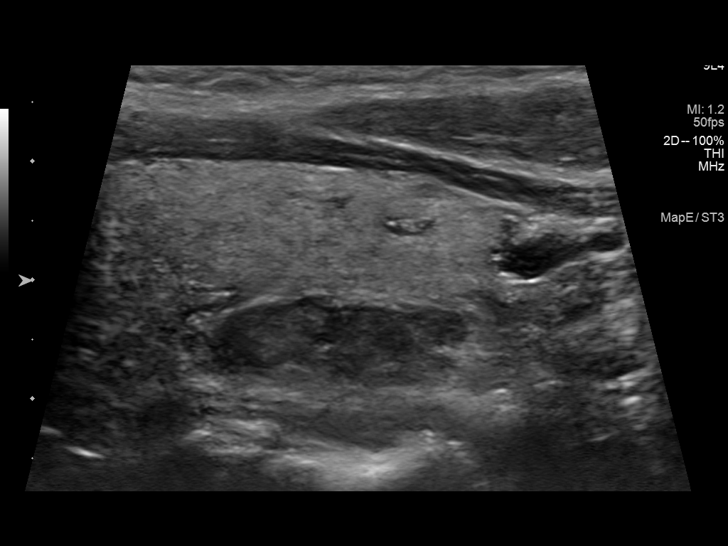
[im 26/61]
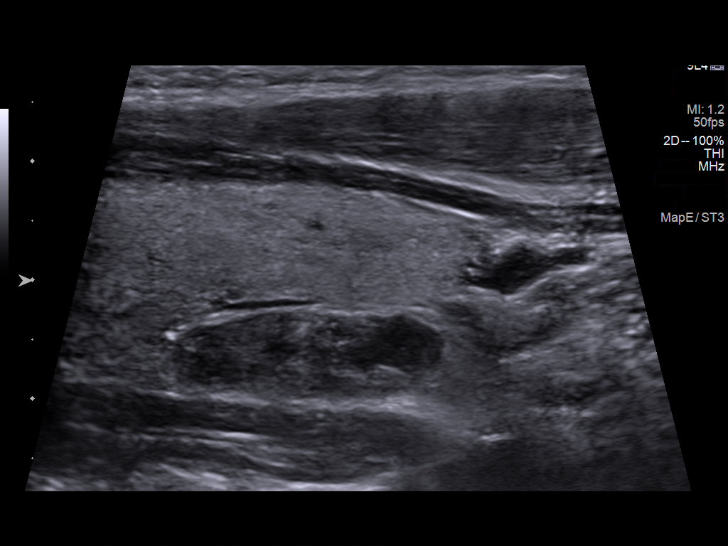
[im 31/61]
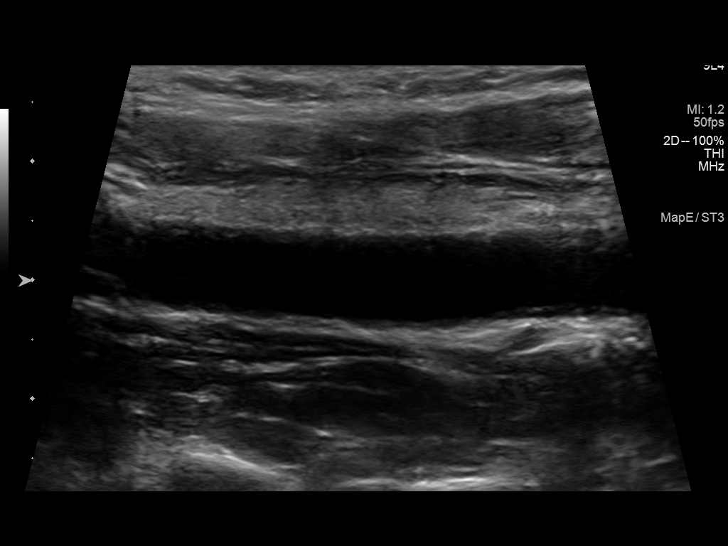
[im 36/61]
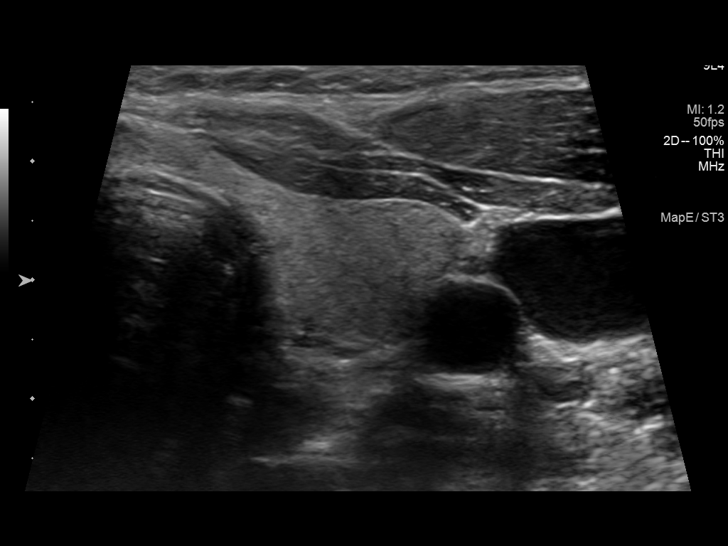
[im 41/61]
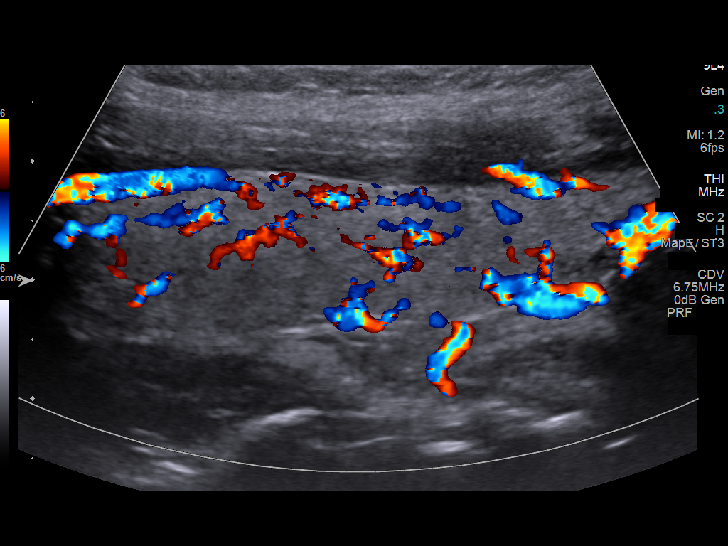
[im 46/61]
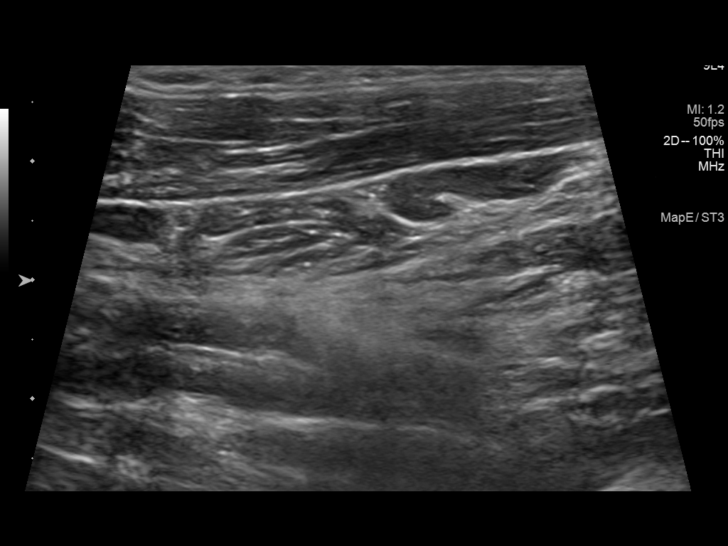
[im 51/61]
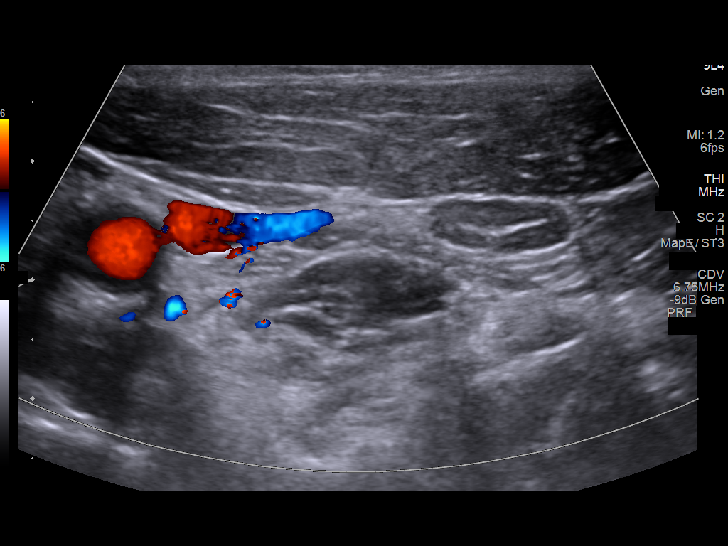
[im 56/61]
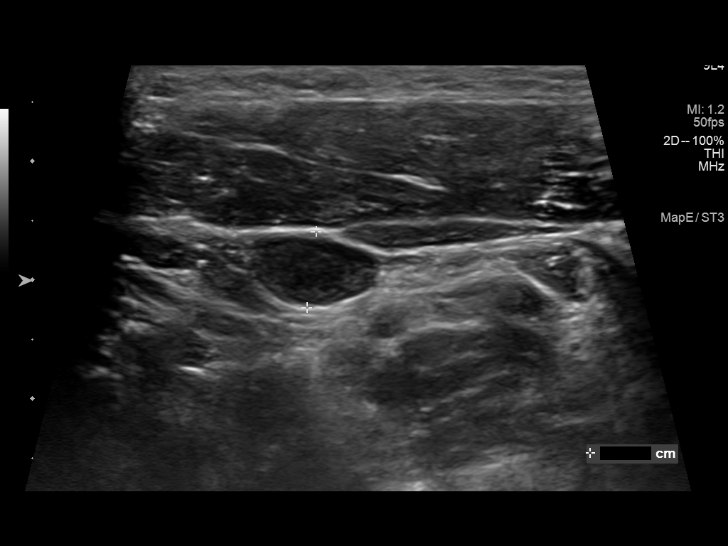
[im 61/61]
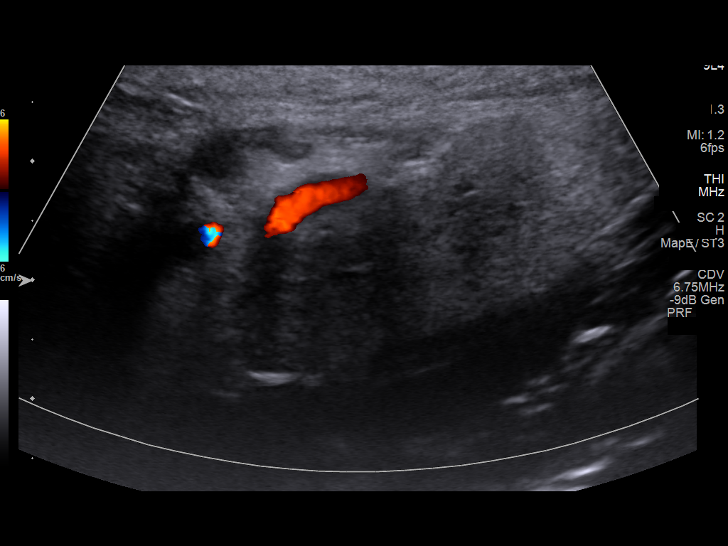

[13 of 25 positions shown; findings below may reference images not displayed]

FINDINGS: Parenchymal Echotexture: Mildly heterogenous

Isthmus:  4 mm

Right lobe:  5.5 x 1.4 x 2.1 cm

Left lobe: 4.8 x 1.4 x 1.7 cm

_________________________________________________________

Estimated total number of nodules >/= 1 cm: 0

Number of spongiform nodules >/=  2 cm not described below (TR1): 0

Number of mixed cystic and solid nodules >/= 1.5 cm not described
below (TR2): 0

_________________________________________________________

Nonspecific minor thyroid heterogeneity without discrete thyroid
nodule or focal abnormality. No hypervascularity.

Posterior to the right thyroid lobe inferiorly and appearing
separate from the gland, there is an oval mildly irregular
hypoechoic elongated nodule measuring 2.6 x 0.8 x 1.0 cm consistent
with enlarged parathyroid gland or adenoma. This correlates with the
nuclear medicine parathyroid scan.

No other parathyroid abnormality appreciated.

Subcentimeter benign-appearing lymph nodes noted bilaterally. No
bulky adenopathy.
IMPRESSION: 2.6 cm right inferior enlarged parathyroid gland versus adenoma,
this correlates with the nuclear medicine parathyroid scan.

No other thyroid abnormality.

The above is in keeping with the ACR TI-RADS recommendations - [HOSPITAL] 5041;[DATE].

## 2023-10-05 ENCOUNTER — Telehealth: Payer: Self-pay | Admitting: Adult Health

## 2023-10-05 DIAGNOSIS — G4733 Obstructive sleep apnea (adult) (pediatric): Secondary | ICD-10-CM

## 2023-10-05 NOTE — Telephone Encounter (Signed)
 Pt said message  on CPAP  machine is "motor life exceeded, contact care provider". Gave patient the phone number to [facility] and scheduled an appt with [facility].

## 2023-10-06 NOTE — Telephone Encounter (Signed)
 Pt returned our call. He states HST was supposed to have been ordered at last visit. I placed order now. Pt aware he might be able to use the April appt here as his initial follow-up. Our office will call him as soon as study is ready to be scheduled. His machine is still working despite the message received. Pt verbalized appreciation for the call.

## 2023-10-06 NOTE — Telephone Encounter (Signed)
 I called and LMVM for pt that was calling about his machine.  If still working?  I did mention that a HST maybe ordered due to over 5 yrs since last one.  Things change over time so that may be possiblity.  He is to call back when convenient.

## 2023-10-06 NOTE — Telephone Encounter (Signed)
 Pt returned phone call, would like a call back.

## 2023-10-08 NOTE — Telephone Encounter (Signed)
Noted, thank you. HST- [employer] no auth req.

## 2023-11-02 ENCOUNTER — Ambulatory Visit: Payer: Managed Care, Other (non HMO) | Admitting: Neurology

## 2023-11-02 DIAGNOSIS — E66812 Obesity, class 2: Secondary | ICD-10-CM

## 2023-11-02 DIAGNOSIS — G4733 Obstructive sleep apnea (adult) (pediatric): Secondary | ICD-10-CM

## 2023-11-02 DIAGNOSIS — J45909 Unspecified asthma, uncomplicated: Secondary | ICD-10-CM

## 2023-11-02 DIAGNOSIS — Z9989 Dependence on other enabling machines and devices: Secondary | ICD-10-CM

## 2023-11-10 NOTE — Progress Notes (Signed)
 [facility]  Shawn Barr 53 year old male 1971-03-11   HOME SLEEP TEST REPORT ( by Dorthey Sawyer OUT test )   STUDY DATE:  11-02-2023, data load 11-10-2023.     ORDERING CLINICIAN:  Butch Penny, NP at [facility]  REFERRING CLINICIAN: Creola Corn, MD    CLINICAL INFORMATION/HISTORY:  05/15/22:   Shawn Barr is a 53 year old male with a history of obstructive sleep apnea on CPAP.  He returns today for follow-up.  He reports that the CPAP is working well.  He continues to notice the benefit.  Reports that he feels well rested the next day.  He has had this machine since 2015.  Shawn Barr  is a 53 year old right-handed African American gentleman presents with a past medical history of hypothyroidism, allergic asthma, hyperlipidemia and blood pressure elevation.  He is taking Norvasc, lisinopril, Lipitor, Advair, Synthroid the above named conditions he also has a Ventolin inhaler for emergencies , when wheezing.   Dr. Timothy Lasso has been directing the patient for an obstructive sleep apnea evaluation. The patient had been tested before and received actually a CPAP in November 2010.  The study was done at [facility] .  At the time he was also diagnosed with elevated liver enzymes and umbillical hernia as well. He felt that apnea wasn't a major factor in his health and wellness.   The patient has never been a smoker, does not use caffeinated beverages, and drinks less than 1 alcoholic beverage a day.   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 He does all this in the bedroom.  Sometimes he sleeps while the TV plays all night. He wakes up at % am for a bathroom break , goes back to sleep and rises at 8. He decides when work starts , he advised. He  needs 2 alarms to get up. He drinks water, no coffee.  He usually feels "OK in the morning. He has variable sleep times and  often plays video games at night. His overall sleep time is less than 5 hours.  He sometimes naps in daytime - after work , which concludes at 5 - by 5.30 PM to 7 PM he may nap. He feels refreshed after these long naps.      Epworth sleepiness score: 2 /24. FSS 13    BMI: 35.1  kg/m   Neck Circumference: XYZ   FINDINGS:   Sleep Summary:   Total Recording Time (hours, min):   7 hours 46 minutes      Total Sleep Time (hours, min): 4 hours 14 minutes, but effective sleep time was only 3 hours 58 minutes.               There was a sleep efficiency of 55% after a sleep latency of only 6 minutes                                    Respiratory Indices:   Calculated pAHI (per hour):      34.7/h     , these apneas were obstructive in nature.  Oxygen Saturation Statistics:   Oxygen Saturation (%) Mean:    93.2%                O2 Saturation Range (%):     Between the nadir at critically low 55% and a maximum saturation of 99.1%                                  O2 Saturation (minutes) <89%:     Less than 5 minutes      Pulse Rate Statistics:   Pulse Mean (bpm):    67 bpm      in NSR        Pulse Range:     During sleep between 57 and 96 bpm.            IMPRESSION:  This HST confirms the presence of severe sleep apnea with obstructive events, very reduced overall sleep time and sleep efficiency, a frequently activated snoring channel indicating snoring to be present.   RECOMMENDATION: This patient should continue with CPAP use he is currently using an AutoSet between 6 and 14 cm water pressure with 2 cm expiratory relief, a residual AHI of 0.5 with a 95th percentile pressure of 12 cm water.  I will order similar settings and the same mask the patient is using now - it provides a good air seal.  In addition weight loss is always recommended to reduce obstructive sleep apnea, I suspect that the sleep  study was overall showing more fragmented sleep because the patient is used to sleeping with his CPAP and is actually dependent on it.    INTERPRETING PHYSICIAN:   Melvyn Novas, MD

## 2023-11-19 ENCOUNTER — Other Ambulatory Visit: Payer: Self-pay | Admitting: Neurology

## 2023-11-19 ENCOUNTER — Telehealth: Payer: Self-pay | Admitting: Neurology

## 2023-11-19 DIAGNOSIS — E66812 Obesity, class 2: Secondary | ICD-10-CM

## 2023-11-19 DIAGNOSIS — Z9989 Dependence on other enabling machines and devices: Secondary | ICD-10-CM | POA: Insufficient documentation

## 2023-11-19 DIAGNOSIS — G4733 Obstructive sleep apnea (adult) (pediatric): Secondary | ICD-10-CM

## 2023-11-19 DIAGNOSIS — J45909 Unspecified asthma, uncomplicated: Secondary | ICD-10-CM

## 2023-11-19 NOTE — Progress Notes (Signed)
 Send order to DME for replacement machine on same settings as currently used.

## 2023-11-19 NOTE — Procedures (Signed)
 [facility]  [name] 53 year old male 1971-03-11   HOME SLEEP TEST REPORT ( by Dorthey Sawyer OUT test )   STUDY DATE:  11-02-2023, data load 11-10-2023.     ORDERING CLINICIAN:  Butch Penny, NP at [facility]  REFERRING CLINICIAN: Creola Corn, MD    CLINICAL INFORMATION/HISTORY:  05/15/22:   Mr. Shawn Barr is a 53 year old male with a history of obstructive sleep apnea on CPAP.  He returns today for follow-up.  He reports that the CPAP is working well.  He continues to notice the benefit.  Reports that he feels well rested the next day.  He has had this machine since 2015.  Shawn Barr  is a 53 year old right-handed African American gentleman presents with a past medical history of hypothyroidism, allergic asthma, hyperlipidemia and blood pressure elevation.  He is taking Norvasc, lisinopril, Lipitor, Advair, Synthroid the above named conditions he also has a Ventolin inhaler for emergencies , when wheezing.   Dr. Timothy Lasso has been directing the patient for an obstructive sleep apnea evaluation. The patient had been tested before and received actually a CPAP in November 2010.  The study was done at [facility] .  At the time he was also diagnosed with elevated liver enzymes and umbillical hernia as well. He felt that apnea wasn't a major factor in his health and wellness.   The patient has never been a smoker, does not use caffeinated beverages, and drinks less than 1 alcoholic beverage a day.   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 He does all this in the bedroom.  Sometimes he sleeps while the TV plays all night. He wakes up at % am for a bathroom break , goes back to sleep and rises at 8. He decides when work starts , he advised. He  needs 2 alarms to get up. He drinks water, no coffee.  He usually feels "OK in the morning. He has variable sleep times and  often plays video games at night. His overall sleep time is less than 5 hours.  He sometimes naps in daytime - after work , which concludes at 5 - by 5.30 PM to 7 PM he may nap. He feels refreshed after these long naps.      Epworth sleepiness score: 2 /24. FSS 13    BMI: 35.1  kg/m   Neck Circumference: XYZ   FINDINGS:   Sleep Summary:   Total Recording Time (hours, min):   7 hours 46 minutes      Total Sleep Time (hours, min): 4 hours 14 minutes, but effective sleep time was only 3 hours 58 minutes.               There was a sleep efficiency of 55% after a sleep latency of only 6 minutes                                    Respiratory Indices:   Calculated pAHI (per hour):      34.7/h     , these apneas were obstructive in nature.  Oxygen Saturation Statistics:   Oxygen Saturation (%) Mean:    93.2%                O2 Saturation Range (%):     Between the nadir at critically low 55% and a maximum saturation of 99.1%                                  O2 Saturation (minutes) <89%:     Less than 5 minutes      Pulse Rate Statistics:   Pulse Mean (bpm):    67 bpm      in NSR        Pulse Range:     During sleep between 57 and 96 bpm.            IMPRESSION:  This HST confirms the presence of severe sleep apnea with obstructive events, very reduced overall sleep time and sleep efficiency, a frequently activated snoring channel indicating snoring to be present.   RECOMMENDATION: This patient should continue with CPAP use he is currently using an AutoSet between 6 and 14 cm water pressure with 2 cm expiratory relief, a residual AHI of 0.5 with a 95th percentile pressure of 12 cm water.  I will order similar settings and the same mask the patient is using now - it provides a good air seal.  In addition weight loss is always recommended to reduce obstructive sleep apnea, I suspect that the sleep  study was overall showing more fragmented sleep because the patient is used to sleeping with his CPAP and is actually dependent on it.    INTERPRETING PHYSICIAN:   Melvyn Novas, MD

## 2023-11-19 NOTE — Telephone Encounter (Signed)
 [facility]  Shawn Barr 53 year old male 1971-03-11   HOME SLEEP TEST REPORT ( by Dorthey Sawyer OUT test )   STUDY DATE:  11-02-2023, data load 11-10-2023.     ORDERING CLINICIAN:  Butch Penny, NP at [facility]  REFERRING CLINICIAN: Creola Corn, MD    CLINICAL INFORMATION/HISTORY:  05/15/22:   Shawn Barr is a 53 year old male with a history of obstructive sleep apnea on CPAP.  He returns today for follow-up.  He reports that the CPAP is working well.  He continues to notice the benefit.  Reports that he feels well rested the next day.  He has had this machine since 2015.  Shawn Barr  is a 53 year old right-handed African American gentleman presents with a past medical history of hypothyroidism, allergic asthma, hyperlipidemia and blood pressure elevation.  He is taking Norvasc, lisinopril, Lipitor, Advair, Synthroid the above named conditions he also has a Ventolin inhaler for emergencies , when wheezing.   Dr. Timothy Lasso has been directing the patient for an obstructive sleep apnea evaluation. The patient had been tested before and received actually a CPAP in November 2010.  The study was done at [facility] .  At the time he was also diagnosed with elevated liver enzymes and umbillical hernia as well. He felt that apnea wasn't a major factor in his health and wellness.   The patient has never been a smoker, does not use caffeinated beverages, and drinks less than 1 alcoholic beverage a day.   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 He does all this in the bedroom.  Sometimes he sleeps while the TV plays all night. He wakes up at % am for a bathroom break , goes back to sleep and rises at 8. He decides when work starts , he advised. He  needs 2 alarms to get up. He drinks water, no coffee.  He usually feels "OK in the morning. He has variable sleep times and  often plays video games at night. His overall sleep time is less than 5 hours.  He sometimes naps in daytime - after work , which concludes at 5 - by 5.30 PM to 7 PM he may nap. He feels refreshed after these long naps.      Epworth sleepiness score: 2 /24. FSS 13    BMI: 35.1  kg/m   Neck Circumference: XYZ   FINDINGS:   Sleep Summary:   Total Recording Time (hours, min):   7 hours 46 minutes      Total Sleep Time (hours, min): 4 hours 14 minutes, but effective sleep time was only 3 hours 58 minutes.               There was a sleep efficiency of 55% after a sleep latency of only 6 minutes                                    Respiratory Indices:   Calculated pAHI (per hour):      34.7/h     , these apneas were obstructive in nature.  Oxygen Saturation Statistics:   Oxygen Saturation (%) Mean:    93.2%                O2 Saturation Range (%):     Between the nadir at critically low 55% and a maximum saturation of 99.1%                                  O2 Saturation (minutes) <89%:     Less than 5 minutes      Pulse Rate Statistics:   Pulse Mean (bpm):    67 bpm      in NSR        Pulse Range:     During sleep between 57 and 96 bpm.            IMPRESSION:  This HST confirms the presence of severe sleep apnea with obstructive events, very reduced overall sleep time and sleep efficiency, a frequently activated snoring channel indicating snoring to be present.   RECOMMENDATION: This patient should continue with CPAP use he is currently using an AutoSet between 6 and 14 cm water pressure with 2 cm expiratory relief, a residual AHI of 0.5 with a 95th percentile pressure of 12 cm water.  I will order similar settings and the same mask the patient is using now - it provides a good air seal.  In addition weight loss is always recommended to reduce obstructive sleep apnea, I suspect that the sleep  study was overall showing more fragmented sleep because the patient is used to sleeping with his CPAP and is actually dependent on it.    INTERPRETING PHYSICIAN:   Melvyn Novas, MD

## 2023-11-23 ENCOUNTER — Encounter: Payer: Self-pay | Admitting: Adult Health

## 2023-11-24 NOTE — Telephone Encounter (Signed)
 I called the patient and discussed that HST showed sleep apnea still present. Order placed for new machine. Currently pt has an appt scheduled for 12/31/23. He will keep that for now. He is aware he must have a follow-up appointment between 30 and 90 days after setup. He may need an appt per insurance in order to get a new machine. We will see if needed.   Order sent to [employer].

## 2023-11-25 NOTE — Telephone Encounter (Signed)
 [place], Maryella Shivers, Otilio Jefferson, RN; [place], Nada Boozer; Kathe Becton; 1 other Received, thank you!

## 2023-12-30 ENCOUNTER — Telehealth: Payer: Self-pay

## 2023-12-30 NOTE — Telephone Encounter (Signed)
 Please see mychart message.

## 2023-12-31 ENCOUNTER — Encounter: Payer: Self-pay | Admitting: Adult Health

## 2023-12-31 ENCOUNTER — Encounter: Payer: Managed Care, Other (non HMO) | Admitting: Adult Health

## 2023-12-31 NOTE — Progress Notes (Signed)
 This encounter was created in error - please disregard.

## 2024-03-16 ENCOUNTER — Encounter: Payer: Self-pay | Admitting: *Deleted

## 2024-03-17 ENCOUNTER — Telehealth: Admitting: Adult Health

## 2024-03-17 DIAGNOSIS — G4733 Obstructive sleep apnea (adult) (pediatric): Secondary | ICD-10-CM

## 2024-03-17 NOTE — Progress Notes (Signed)
 PATIENT: Shawn Barr DOB: 11/29/1970  REASON FOR VISIT: follow up HISTORY FROM: patient PRIMARY NEUROLOGIST: Dr. Albertina Hugger   Virtual Visit via Video Note  I connected with Shawn Barr on 03/17/24 at 11:15 AM EDT by a video enabled telemedicine application located remotely at home office and verified that I am speaking with the correct person using two identifiers who was located at their own home in [place]   I discussed the limitations of evaluation and management by telemedicine and the availability of in person appointments. The patient expressed understanding and agreed to proceed.   PATIENT: Shawn Barr DOB: [birth year]  REASON FOR VISIT: follow up HISTORY FROM: patient  HISTORY OF PRESENT ILLNESS: Today   Shawn Barr is a 53 y.o. male with a history of OSA on CPAP. Returns today for follow-up.  He reports that his new machine is working well.  He denies any new issues.  Reports that CPAP continues to work well for him. his download is below     HISTORY   REVIEW OF SYSTEMS: Out of a complete 14 system review of symptoms, the patient complains only of the following symptoms, and all other reviewed systems are negative.  ALLERGIES: Allergies  Allergen Reactions   Milk-Related Compounds Diarrhea   Peanut-Containing Drug Products Other (See Comments)    Allergy test    HOME MEDICATIONS: Outpatient Medications Prior to Visit  Medication Sig Dispense Refill   albuterol  (PROVENTIL  HFA;VENTOLIN  HFA) 108 (90 BASE) MCG/ACT inhaler Inhale 1-2 puffs into the lungs every 6 (six) hours as needed for wheezing or shortness of breath.      amLODipine  (NORVASC ) 5 MG tablet Take 5 mg by mouth daily.     atorvastatin (LIPITOR) 80 MG tablet Take 80 mg by mouth daily.     fexofenadine (ALLEGRA) 180 MG tablet Take 180 mg by mouth daily.     fluticasone-salmeterol (ADVAIR HFA) 115-21 MCG/ACT inhaler Inhale 1 puff into the lungs daily as needed (wheezing).      hydrALAZINE  (APRESOLINE ) 25 MG tablet Take 25 mg by mouth at bedtime.     Facility-Administered Medications Prior to Visit  Medication Dose Route Frequency Provider Last Rate Last Admin   0.9 %  sodium chloride  infusion  500 mL Intravenous Continuous Daina Drum, MD        PAST MEDICAL HISTORY: Past Medical History:  Diagnosis Date   Allergic rhinitis    Asthma    uses inhaler PRN   Broken arm    Left   Chronic LBP    Daytime somnolence    Elevated LFTs    Fatty liver   Gynecomastia    Heart murmur    as teen   Hyperlipidemia    on meds   Hypertension    on meds   Hypertriglyceridemia    on meds   Hypothyroidism    OSA (obstructive sleep apnea)    CPAP   Seasonal allergies    Sleep apnea    uses CPAP   Umbilical hernia     PAST SURGICAL HISTORY: Past Surgical History:  Procedure Laterality Date   ANKLE SURGERY  09/29/1993   ANTERIOR LAT LUMBAR FUSION Right 07/11/2020   Procedure: RIGHT-LUMBAR 4- LUMBAR 5 LATERAL INTERBODY FUSION WITH INSTRUMENTATION AND ALLOGRAFT;  Surgeon: Virl Grimes, MD;  Location: [facility] OR;  Service: Orthopedics;  Laterality: Right;   arm surgery  05/31/1999   HAND SURGERY  09/29/1990   PARATHYROIDECTOMY Right 01/02/2022   Procedure:  RIGHT PARATHYROIDECTOMY;  Surgeon: Oralee Billow, MD;  Location: [facility];  Service: General;  Laterality: Right;   TONSILLECTOMY  09/29/1989   WISDOM TOOTH EXTRACTION      FAMILY HISTORY: Family History  Problem Relation Age of Onset   Glaucoma Mother    Hypertension Father    Diabetes Father    Sleep apnea Father    Diabetes Other    Diabetes Other    Colon polyps Neg Hx    Colon cancer Neg Hx    Esophageal cancer Neg Hx    Stomach cancer Neg Hx    Rectal cancer Neg Hx     SOCIAL HISTORY: Social History   Socioeconomic History   Marital status: Married    Spouse name: Wallene Gum   Number of children: 1   Years of education: College   Highest education level: Not on file  Occupational History    Not on file  Tobacco Use   Smoking status: Never   Smokeless tobacco: Never  Vaping Use   Vaping status: Never Used  Substance and Sexual Activity   Alcohol use: Not Currently    Alcohol/week: 0.0 - 6.0 standard drinks of alcohol    Comment: occ   Drug use: No   Sexual activity: Yes  Other Topics Concern   Not on file  Social History Narrative   Patient is married Wallene Gum) and lives at home with his wife and one child. Patient is working full-time. Patient has a college education. Patient is right-handed. Patient drinks 2-3 cups of tea daily.   Social Drivers of Corporate investment banker Strain: Not on file  Food Insecurity: Not on file  Transportation Needs: Not on file  Physical Activity: Not on file  Stress: Not on file  Social Connections: Not on file  Intimate Partner Violence: Not on file      PHYSICAL EXAM Generalized: Well developed, in no acute distress   Neurological examination  Mentation: Alert oriented to time, place, history taking. Follows all commands speech and language fluent Cranial nerve II-XII: Facial symmetry noted.   DIAGNOSTIC DATA (LABS, IMAGING, TESTING) - I reviewed patient records, labs, notes, testing and imaging myself where available.  Lab Results  Component Value Date   WBC 5.4 12/20/2021   HGB 13.2 12/20/2021   HCT 41.0 12/20/2021   MCV 86.5 12/20/2021   PLT 277 12/20/2021      Component Value Date/Time   NA 137 12/20/2021 0855   K 3.5 12/20/2021 0855   CL 107 12/20/2021 0855   CO2 23 12/20/2021 0855   GLUCOSE 97 12/20/2021 0855   BUN 11 12/20/2021 0855   CREATININE 0.55 (L) 12/20/2021 0855   CALCIUM 10.8 (H) 12/20/2021 0855   PROT 6.6 07/11/2020 0700   ALBUMIN 3.8 07/11/2020 0700   AST 47 (H) 07/11/2020 0700   ALT 52 (H) 07/11/2020 0700   ALKPHOS 61 07/11/2020 0700   BILITOT 0.8 07/11/2020 0700   GFRNONAA >60 12/20/2021 0855      ASSESSMENT AND PLAN 53 y.o. year old male  has a past medical history of Allergic  rhinitis, Asthma, Broken arm, Chronic LBP, Daytime somnolence, Elevated LFTs, Gynecomastia, Heart murmur, Hyperlipidemia, Hypertension, Hypertriglyceridemia, Hypothyroidism, OSA (obstructive sleep apnea), Seasonal allergies, Sleep apnea, and Umbilical hernia. here with:  OSA on CPAP  CPAP compliance excellent Residual AHI is good Encouraged patient to continue using CPAP nightly and > 4 hours each night F/U in 1 year or sooner if needed  Clem Currier, MSN, NP-C 03/17/2024, 11:21 AM [facility] [address], [address] [place], [place] [address] [phone]

## 2025-03-21 ENCOUNTER — Telehealth: Admitting: Adult Health
